# Patient Record
Sex: Female | Born: 1970 | Race: White | Hispanic: No | Marital: Married | Smoking: Never smoker
Health system: Southern US, Community
[De-identification: ages and names within clinical notes are randomized; demographics above are authoritative.]

## PROBLEM LIST (undated history)

## (undated) DIAGNOSIS — M797 Fibromyalgia: Secondary | ICD-10-CM

## (undated) DIAGNOSIS — E079 Disorder of thyroid, unspecified: Secondary | ICD-10-CM

## (undated) DIAGNOSIS — R519 Headache, unspecified: Secondary | ICD-10-CM

## (undated) DIAGNOSIS — E039 Hypothyroidism, unspecified: Secondary | ICD-10-CM

## (undated) DIAGNOSIS — I10 Essential (primary) hypertension: Secondary | ICD-10-CM

## (undated) DIAGNOSIS — E240 Pituitary-dependent Cushing's disease: Secondary | ICD-10-CM

## (undated) HISTORY — PX: TUBAL LIGATION: SHX77

## (undated) HISTORY — PX: CERVICAL FUSION: SHX112

---

## 2015-09-17 DIAGNOSIS — M542 Cervicalgia: Secondary | ICD-10-CM | POA: Insufficient documentation

## 2015-09-17 DIAGNOSIS — R29898 Other symptoms and signs involving the musculoskeletal system: Secondary | ICD-10-CM | POA: Insufficient documentation

## 2015-09-17 DIAGNOSIS — M5412 Radiculopathy, cervical region: Secondary | ICD-10-CM | POA: Insufficient documentation

## 2016-09-01 DIAGNOSIS — I10 Essential (primary) hypertension: Secondary | ICD-10-CM | POA: Insufficient documentation

## 2017-07-01 DIAGNOSIS — E538 Deficiency of other specified B group vitamins: Secondary | ICD-10-CM | POA: Insufficient documentation

## 2017-07-01 DIAGNOSIS — E559 Vitamin D deficiency, unspecified: Secondary | ICD-10-CM | POA: Insufficient documentation

## 2018-03-08 DIAGNOSIS — K635 Polyp of colon: Secondary | ICD-10-CM | POA: Insufficient documentation

## 2018-03-08 DIAGNOSIS — G5600 Carpal tunnel syndrome, unspecified upper limb: Secondary | ICD-10-CM | POA: Insufficient documentation

## 2018-03-08 DIAGNOSIS — G8929 Other chronic pain: Secondary | ICD-10-CM | POA: Insufficient documentation

## 2018-03-08 DIAGNOSIS — M797 Fibromyalgia: Secondary | ICD-10-CM

## 2018-03-08 DIAGNOSIS — F429 Obsessive-compulsive disorder, unspecified: Secondary | ICD-10-CM | POA: Insufficient documentation

## 2018-03-08 DIAGNOSIS — G4730 Sleep apnea, unspecified: Secondary | ICD-10-CM | POA: Insufficient documentation

## 2018-03-08 DIAGNOSIS — F32A Depression, unspecified: Secondary | ICD-10-CM | POA: Insufficient documentation

## 2018-03-13 DIAGNOSIS — M7501 Adhesive capsulitis of right shoulder: Secondary | ICD-10-CM | POA: Insufficient documentation

## 2018-04-11 DIAGNOSIS — D170 Benign lipomatous neoplasm of skin and subcutaneous tissue of head, face and neck: Secondary | ICD-10-CM | POA: Insufficient documentation

## 2018-06-19 DIAGNOSIS — M174 Other bilateral secondary osteoarthritis of knee: Secondary | ICD-10-CM | POA: Insufficient documentation

## 2019-05-30 DIAGNOSIS — M50023 Cervical disc disorder at C6-C7 level with myelopathy: Secondary | ICD-10-CM | POA: Insufficient documentation

## 2019-10-12 ENCOUNTER — Ambulatory Visit: Payer: Self-pay | Attending: Internal Medicine

## 2019-10-12 DIAGNOSIS — Z23 Encounter for immunization: Secondary | ICD-10-CM

## 2019-10-12 NOTE — Progress Notes (Signed)
   Covid-19 Vaccination Clinic  Name:  Cheyenne Lynn    MRN: [id number] DOB: 1971-01-06  10/12/2019  [name] was observed post Covid-19 immunization for 15 minutes without incident. She was provided with Vaccine Information Sheet and instruction to access the V-Safe system.   [name] was instructed to call 911 with any severe reactions post vaccine: Marland Kitchen Difficulty breathing  . Swelling of face and throat  . A fast heartbeat  . A bad rash all over body  . Dizziness and weakness   Immunizations Administered    Name Date Dose VIS Date Route   Pfizer COVID-19 Vaccine 10/12/2019  1:41 PM 0.3 mL 07/06/2019 Intramuscular   Manufacturer: [place]   Lot: [id number]   [place]: [id number]

## 2019-11-06 ENCOUNTER — Ambulatory Visit: Payer: Self-pay | Attending: Internal Medicine

## 2019-11-06 DIAGNOSIS — Z23 Encounter for immunization: Secondary | ICD-10-CM

## 2019-11-06 NOTE — Progress Notes (Signed)
   Covid-19 Vaccination Clinic  Name:  Daelin Cichy    MRN: [id number] DOB: [birth year]  11/06/2019  Ms. Markie was observed post Covid-19 immunization for 15 minutes without incident. She was provided with Vaccine Information Sheet and instruction to access the V-Safe system.   Ms. Disharoon was instructed to call 911 with any severe reactions post vaccine: Marland Kitchen Difficulty breathing  . Swelling of face and throat  . A fast heartbeat  . A bad rash all over body  . Dizziness and weakness   Immunizations Administered    Name Date Dose VIS Date Route   Pfizer COVID-19 Vaccine 11/06/2019  2:29 PM 0.3 mL 07/06/2019 Intramuscular   Manufacturer: [place]   Lot: [id number]   [place]: [id number]

## 2020-01-13 DIAGNOSIS — M2550 Pain in unspecified joint: Secondary | ICD-10-CM | POA: Insufficient documentation

## 2020-03-14 DIAGNOSIS — Z79899 Other long term (current) drug therapy: Secondary | ICD-10-CM | POA: Insufficient documentation

## 2021-06-09 ENCOUNTER — Ambulatory Visit
Admission: RE | Admit: 2021-06-09 | Discharge: 2021-06-09 | Disposition: A | Discharge status: Home / Self Care | Payer: BC Managed Care – PPO | Source: Ambulatory Visit

## 2021-06-09 ENCOUNTER — Other Ambulatory Visit: Payer: Self-pay

## 2021-06-09 ENCOUNTER — Ambulatory Visit (INDEPENDENT_AMBULATORY_CARE_PROVIDER_SITE_OTHER)
Admission: RE | Admit: 2021-06-09 | Discharge: 2021-06-09 | Disposition: A | Discharge status: Home / Self Care | Payer: BC Managed Care – PPO | Source: Ambulatory Visit | Attending: Emergency Medicine | Admitting: Emergency Medicine

## 2021-06-09 VITALS — BP 161/94 | HR 77 | Temp 98.6°F | Resp 18 | Ht 62.0 in | Wt 283.0 lb

## 2021-06-09 DIAGNOSIS — I10 Essential (primary) hypertension: Secondary | ICD-10-CM

## 2021-06-09 DIAGNOSIS — R519 Headache, unspecified: Secondary | ICD-10-CM

## 2021-06-09 DIAGNOSIS — M797 Fibromyalgia: Secondary | ICD-10-CM

## 2021-06-09 HISTORY — DX: Essential (primary) hypertension: I10

## 2021-06-09 HISTORY — DX: Fibromyalgia: M79.7

## 2021-06-09 MED ORDER — METHYLPREDNISOLONE 4 MG PO TBPK: ORAL_TABLET | ORAL | 0 refills | Status: DC

## 2021-06-09 NOTE — ED Triage Notes (Signed)
C/o right sided headache that began on 10/29, reported as a dull ache since 05/29/21

## 2021-06-09 NOTE — ED Provider Notes (Signed)
[facility]    CSN: [id number] Arrival date & time: 06/09/21  1343      History   Chief Complaint Chief Complaint  Patient presents with   Headache   Appt@2p     HPI Cheyenne Lynn is a 50 y.o. female.   HPI  50 year old female here for evaluation of right-sided headache.  Patient reports that her headache began on the right side of her head on 05/23/2021 and presented like her typical migraine.  She reports that the pain got worse over period of couple days and then seemed to get better before we intensifying on 05/29/2021.  She states that it is sharp will decrease throughout the day but then increases when she goes to bed at night.  If she lays on her right side or puts pressure on the right side of her head the pain improves but when she relieves pressure or lays on her left side the pain returns and wakes her up from sleep.  She states her pain level as a 7 out of 10 constantly.  She states that when she applies heat to the right side of her face when she lays down she has a salty taste in her mouth but denies any sweet taste.  She has no nasal congestion or runny nose.  She does have blurry vision in her central vision of her right eye and describes it as a flash bulb.that tracks with her eye movements and is there constantly.  She has had no nausea or vomiting, light sensitive or sound sensitivity, as she normally does with her migraines.  She also denies numbness or tingling in any of her extremities.  She has not had any falls or head injuries.  She did have a cervical fusion a year ago.  She is currently being evaluated by neurology for tremors that developed approximately a year ago.  Patient also has decreased grip strength in her right hand and in her right upper arm that has been present for the past year.  She does see neurology for the tremors but has not consulted them about her headache.  Her migraines are managed by her primary care provider.  Patient reports  that she has been to her dentist to have her pain evaluated because she thought it may be coming from her teeth and they could not find any abnormality.  She also recently had new glasses prescribed and she returns to her ophthalmologist for evaluation last week who states they cannot find any intraocular abnormalities.  Past Medical History:  Diagnosis Date   Fibromyalgia    Hypertension     Patient Active Problem List   Diagnosis Date Noted   Fibromyalgia 06/09/2021   Hypertension 06/09/2021    Past Surgical History:  Procedure Laterality Date   CERVICAL FUSION     TUBAL LIGATION      OB History   No obstetric history on file.      Home Medications    Prior to Admission medications   Medication Sig Start Date End Date Taking? Authorizing Provider  celecoxib (CELEBREX) 100 MG capsule Take by mouth. 08/02/20  Yes [provider]  methylPREDNISolone (MEDROL DOSEPAK) 4 MG TBPK tablet Take according to the package insert. 06/09/21  Yes Margarette Canada, NP  norethindrone-ethinyl estradiol (LOESTRIN) 1-20 MG-MCG tablet Take by mouth. 03/25/17  Yes [provider]    Family History History reviewed. No pertinent family history.  Social History Social History   Tobacco Use   Smoking status:  Never   Smokeless tobacco: Never  Vaping Use   Vaping Use: Never used  Substance Use Topics   Alcohol use: Not Currently   Drug use: Not Currently     Allergies   Patient has no known allergies.   Review of Systems Review of Systems  Constitutional:  Negative for activity change, appetite change and fever.  HENT:  Negative for congestion, ear pain and rhinorrhea.   Eyes:  Positive for visual disturbance. Negative for photophobia, pain, discharge and itching.  Respiratory:  Negative for cough.   Gastrointestinal:  Negative for nausea and vomiting.  Neurological:  Positive for tremors, weakness and headaches. Negative for numbness.  Hematological: Negative.    Psychiatric/Behavioral: Negative.      Physical Exam Triage Vital Signs ED Triage Vitals  Enc Vitals Group     BP 06/09/21 1448 (!) 161/94     Pulse Rate 06/09/21 1448 77     Resp 06/09/21 1448 18     Temp 06/09/21 1448 98.6 F (37 C)     Temp Source 06/09/21 1448 Oral     SpO2 06/09/21 1448 98 %     Weight 06/09/21 1451 283 lb (128.4 kg)     Height 06/09/21 1451 5\' 2"  (1.575 m)     Head Circumference --      Peak Flow --      Pain Score 06/09/21 1442 6     Pain Loc --      Pain Edu? --      Excl. in [place]? --    No data found.  Updated Vital Signs BP (!) 161/94 (BP Location: Left Arm)   Pulse 77   Temp 98.6 F (37 C) (Oral)   Resp 18   Ht 5\' 2"  (1.575 m)   Wt 283 lb (128.4 kg)   SpO2 98%   BMI 51.76 kg/m   Visual Acuity Right Eye Distance:   Left Eye Distance:   Bilateral Distance:    Right Eye Near:   Left Eye Near:    Bilateral Near:     Physical Exam Vitals and nursing note reviewed.  Constitutional:      General: She is not in acute distress.    Appearance: Normal appearance. She is not ill-appearing.  HENT:     Head: Normocephalic and atraumatic.     Right Ear: Tympanic membrane, ear canal and external ear normal. There is no impacted cerumen.     Left Ear: Tympanic membrane, ear canal and external ear normal. There is no impacted cerumen.     Nose: Nose normal. No congestion or rhinorrhea.     Mouth/Throat:     Mouth: Mucous membranes are moist.     Pharynx: Oropharynx is clear. No posterior oropharyngeal erythema.  Eyes:     General: No scleral icterus.       Right eye: No discharge.        Left eye: No discharge.     Extraocular Movements: Extraocular movements intact.     Pupils: Pupils are equal, round, and reactive to light.  Cardiovascular:     Rate and Rhythm: Normal rate and regular rhythm.     Pulses: Normal pulses.     Heart sounds: Normal heart sounds. No murmur heard.   No gallop.  Pulmonary:     Effort: Pulmonary effort is  normal.     Breath sounds: Normal breath sounds. No wheezing, rhonchi or rales.  Musculoskeletal:     Cervical back: Normal range of  motion and neck supple.  Skin:    General: Skin is warm and dry.     Capillary Refill: Capillary refill takes less than 2 seconds.     Findings: No erythema or rash.  Neurological:     General: No focal deficit present.     Mental Status: She is alert and oriented to person, place, and time.     Cranial Nerves: No cranial nerve deficit.     Sensory: No sensory deficit.     Motor: Weakness present.     Deep Tendon Reflexes: Reflexes abnormal.  Psychiatric:        Mood and Affect: Mood normal.        Behavior: Behavior normal.        Thought Content: Thought content normal.        Judgment: Judgment normal.     UC Treatments / Results  Labs (all labs ordered are listed, but only abnormal results are displayed) Labs Reviewed - No data to display  EKG   Radiology CT Head Wo Contrast  Result Date: 06/09/2021 CLINICAL DATA:  Headache.  New neuro deficit EXAM: CT HEAD WITHOUT CONTRAST TECHNIQUE: Contiguous axial images were obtained from the base of the skull through the vertex without intravenous contrast. COMPARISON:  None. FINDINGS: Brain: No acute intracranial hemorrhage. No focal mass lesion. No CT evidence of acute infarction. No midline shift or mass effect. No hydrocephalus. Basilar cisterns are patent. Vascular: No hyperdense vessel or unexpected calcification. Skull: Normal. Negative for fracture or focal lesion. Sinuses/Orbits: Paranasal sinuses and mastoid air cells are clear. Orbits are clear. Other: None. IMPRESSION: Normal at CT. Electronically Signed   By: Suzy Bouchard M.D.   On: 06/09/2021 16:16    Procedures Procedures (including critical care time)  Medications Ordered in UC Medications - No data to display  Initial Impression / Assessment and Plan / UC Course  I have reviewed the triage vital signs and the nursing  notes.  Pertinent labs & imaging results that were available during my care of the patient were reviewed by me and considered in my medical decision making (see chart for details).  Patient is a pleasant, nontoxic-appearing 50 year old female here for evaluation of right-sided headache that has been present for the last 17 days and has been unabated by migraine medication, Tylenol, and over-the-counter NSAIDs.  Patient is currently taking low-dose naltrexone for fibromyalgia which is not any effect on her headache pain.  Patient is alert and oriented x3 and cranial nerves II through XII are intact.  Patient does have decreased grip strength in her right hand and is decreased right upper extremity strength at 3/5 versus her left which is 5/5.  Bilateral lower extremity length is 5/5.  Patient has 2+ DTRs in bilateral upper extremities but her lower extremity DTRs are nonreactive.  She states that this is normal for her.  Pupils equal round and reactive and EOMs intact.  Patient's nasal mucosa is pink and moist without erythema, edema, or discharge.  Patient does have tenderness to percussion of her frontal and maxillary sinuses though she states it more affects her headache versus direct pain to her sinus cavities.  She is had no nasal discharge.  Cardiopulmonary exam is clear lung sounds in all fields.  The etiology of the patient's headache is unclear but it is concerning because it has been present and constant at a 7/10 for the last 17 days.  We will contact her insurance to attempt a prior Auth for head  CT without contrast to look for mass, lesion, or bleed that might explain her visual disturbance and headache.  If her head CT is normal I will have her follow-up with her neurologist and discharge her home on a low-dose steroid pack to see if that helps her headache.  Radiology impression head CT scan is negative.  There is no intracranial hemorrhage, mass lesion, or evidence of acute infarction.  The  sinuses and paranasal sinuses are clear.  The etiology of patient's headache is unclear though it may be status migrainous.  We will trial a low-dose steroid pack and have patient follow-up with her neurologist for continued symptoms.   Final Clinical Impressions(s) / UC Diagnoses   Final diagnoses:  Acute intractable headache, unspecified headache type     Discharge Instructions      Your head CT was negative for any mass or bleed and it did not show any evidence of sinusitis.  Your headache may be a result of what is called status migrainous which is a refractory, continuous migraine headache.  I am going to treat you with a Medrol Dosepak, which is low-dose steroid, and when she does start tomorrow morning with breakfast.  You will take it over a period of 6 days according to the package insert.  If you do not notice an improvement in your pain, or your symptoms worsen, I recommend you follow-up with your neurologist and she will need an MRI of your head.     ED Prescriptions     Medication Sig Dispense Auth. Provider   methylPREDNISolone (MEDROL DOSEPAK) 4 MG TBPK tablet Take according to the package insert. 1 each Margarette Canada, NP      PDMP not reviewed this encounter.   Margarette Canada, NP 06/09/21 [phone]

## 2021-06-09 NOTE — ED Triage Notes (Signed)
Per insurance no prior Auth needed. Ref#[id number]

## 2021-06-09 NOTE — Discharge Instructions (Addendum)
Your head CT was negative for any mass or bleed and it did not show any evidence of sinusitis.  Your headache may be a result of what is called status migrainous which is a refractory, continuous migraine headache.  I am going to treat you with a Medrol Dosepak, which is low-dose steroid, and when she does start tomorrow morning with breakfast.  You will take it over a period of 6 days according to the package insert.  If you do not notice an improvement in your pain, or your symptoms worsen, I recommend you follow-up with your neurologist and she will need an MRI of your head.

## 2021-08-25 ENCOUNTER — Ambulatory Visit: Payer: Self-pay

## 2021-09-18 DIAGNOSIS — E236 Other disorders of pituitary gland: Secondary | ICD-10-CM | POA: Insufficient documentation

## 2021-10-06 DIAGNOSIS — S0990XA Unspecified injury of head, initial encounter: Secondary | ICD-10-CM | POA: Insufficient documentation

## 2021-10-13 ENCOUNTER — Ambulatory Visit: Payer: Self-pay

## 2021-11-10 ENCOUNTER — Ambulatory Visit
Admission: RE | Admit: 2021-11-10 | Discharge: 2021-11-10 | Disposition: A | Discharge status: Home / Self Care | Payer: BC Managed Care – PPO | Source: Ambulatory Visit | Attending: Emergency Medicine | Admitting: Emergency Medicine

## 2021-11-10 ENCOUNTER — Ambulatory Visit (INDEPENDENT_AMBULATORY_CARE_PROVIDER_SITE_OTHER): Payer: BC Managed Care – PPO

## 2021-11-10 ENCOUNTER — Ambulatory Visit
Admit: 2021-11-10 | Discharge: 2021-11-10 | Disposition: A | Discharge status: Home / Self Care | Payer: BC Managed Care – PPO | Attending: Emergency Medicine | Admitting: Emergency Medicine

## 2021-11-10 VITALS — BP 163/98 | HR 100 | Temp 98.2°F | Resp 18

## 2021-11-10 DIAGNOSIS — M79605 Pain in left leg: Secondary | ICD-10-CM | POA: Diagnosis not present

## 2021-11-10 DIAGNOSIS — I10 Essential (primary) hypertension: Secondary | ICD-10-CM | POA: Insufficient documentation

## 2021-11-10 DIAGNOSIS — M79662 Pain in left lower leg: Secondary | ICD-10-CM | POA: Diagnosis not present

## 2021-11-10 HISTORY — DX: Disorder of thyroid, unspecified: E07.9

## 2021-11-10 NOTE — ED Triage Notes (Signed)
Pt her with left leg aching x 2.5 weeks. Pt has a brain tumor and cushings disease and states her husband made her come.  ?

## 2021-11-10 NOTE — ED Provider Notes (Signed)
?[facility] ? ? ? ?CSN: [id number] ?Arrival date & time: 11/10/21  1101 ? ? ?  ? ?History   ?Chief Complaint ?Chief Complaint  ?Patient presents with  ? Leg Pain  ?  Entered by patient  ? ? ?HPI ?Cheyenne Lynn is a 51 y.o. female.  Patient presents with left anterior lower leg pain x2.5 weeks.  No falls or trauma.  She reports the pain is intermittent and associated with tingling and weakness when pain occurs.  No wounds, bruising, redness, unusual swelling, chest pain, shortness of breath, or other symptoms.  Patient states she was recently diagnosed with Cushing's disease and pituitary adenoma.  She is concerned for possible occult fracture due to demineralization or possible blood clot.  Her medical history includes pituitary adenoma, chronic migraines, fibromyalgia, hypertension, morbid obesity, cervical disc disorder, cervical radiculopathy, chronic knee pain, obsessive-compulsive disorder, sleep apnea. ? ?The history is provided by the patient and medical records.  ? ?Past Medical History:  ?Diagnosis Date  ? Fibromyalgia   ? Hypertension   ? Thyroid disease   ? ? ?Patient Active Problem List  ? Diagnosis Date Noted  ? Head trauma 10/06/2021  ? 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 Hypertension 06/09/2021  ? Encounter for medication management 03/14/2020  ? Multiple joint pain 01/13/2020  ? 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 Other bilateral secondary osteoarthritis of knee 06/19/2018  ? Lipoma of scalp 04/11/2018  ? 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 Fibromyalgia 03/08/2018  ? Carpal tunnel syndrome 03/08/2018  ? Chronic knee pain 03/08/2018  ? Colon polyps 03/08/2018  ? Depression 03/08/2018  ? OCD (obsessive compulsive disorder) 03/08/2018  ? Sleep apnea 03/08/2018  ? Vitamin B12 deficiency 07/01/2017  ? Vitamin D deficiency 07/01/2017  ? Morbid obesity due to excess calories ([place]) 11/01/2016  ? Essential hypertension 09/01/2016  ? Cervical  radiculopathy 09/17/2015  ? Upper extremity weakness 09/17/2015  ? Neck pain 09/17/2015  ? ? ?Past Surgical History:  ?Procedure Laterality Date  ? CERVICAL FUSION    ? TUBAL LIGATION    ? ? ?OB History   ?No obstetric history on file. ?  ? ? ? ?Home Medications   ? ?Prior to Admission medications   ?Medication Sig Start Date End Date Taking? Authorizing Provider  ?celecoxib (CELEBREX) 100 MG capsule Take by mouth. 09/26/21  Yes [provider]  ?cyanocobalamin (,VITAMIN B-12,) 1000 MCG/ML injection Inject into the muscle. 07/01/17  Yes [provider]  ?NALTREXONE HCL PO Take 4.5 mg by mouth 2 (two) times daily. 09/21/21  Yes [provider]  ?norethindrone-ethinyl estradiol (LOESTRIN) 1-20 MG-MCG tablet Take by mouth. 08/04/21  Yes [provider]  ?amLODipine (NORVASC) 2.5 MG tablet Take 2.5 mg by mouth daily. 10/23/21   [provider]  ?buPROPion (WELLBUTRIN) 75 MG tablet Take 75 mg by mouth daily. 10/15/21   [provider]  ?cabergoline (DOSTINEX) 0.5 MG tablet Take by mouth. 11/02/21   [provider]  ?celecoxib (CELEBREX) 100 MG capsule Take by mouth. 08/02/20   [provider]  ?cyclobenzaprine (FLEXERIL) 10 MG tablet Take 10 mg by mouth at bedtime. 09/20/21   [provider]  ?dexamethasone (DECADRON) 1 MG tablet Take 1 mg by mouth once. 10/14/21   [provider]  ?levothyroxine (SYNTHROID) 50 MCG tablet Take 50 mcg by mouth daily. 11/01/21   [provider]  ?methylPREDNISolone (MEDROL DOSEPAK) 4 MG TBPK tablet Take according to the package insert.  06/09/21   Margarette Canada, NP  ?norethindrone-ethinyl estradiol (LOESTRIN) 1-20 MG-MCG tablet Take by mouth. 03/25/17   [provider]  ?topiramate (TOPAMAX) 25 MG tablet Take 75 mg by mouth at bedtime. 10/08/21   [provider]  ?VITAMIN D, CHOLECALCIFEROL, PO Take by mouth.    [provider]  ?zonisamide (ZONEGRAN) 100 MG capsule SMARTSIG:1  Capsule(s) By Mouth Every Evening 10/27/21   [provider]  ? ? ?Family History ?History reviewed. No pertinent family history. ? ?Social History ?Social History  ? ?Tobacco Use  ? Smoking status: Never  ? Smokeless tobacco: Never  ?Vaping Use  ? Vaping Use: Never used  ?Substance Use Topics  ? Alcohol use: Not Currently  ? Drug use: Not Currently  ? ? ? ?Allergies   ?Eggs or egg-derived products, Onion, Procaine, Chicken allergy, and Gabapentin ? ? ?Review of Systems ?Review of Systems  ?Constitutional:  Negative for chills and fever.  ?Respiratory:  Negative for cough and shortness of breath.   ?Cardiovascular:  Negative for chest pain and palpitations.  ?Musculoskeletal:  Positive for arthralgias and gait problem.  ?Skin:  Negative for color change, rash and wound.  ?Neurological:  Negative for weakness and numbness.  ?     Tingling and weakness in left lower leg with pain episodes.  ?All other systems reviewed and are negative. ? ? ?Physical Exam ?Triage Vital Signs ?ED Triage Vitals  ?Enc Vitals Group  ?   BP   ?   Pulse   ?   Resp   ?   Temp   ?   Temp src   ?   SpO2   ?   Weight   ?   Height   ?   Head Circumference   ?   Peak Flow   ?   Pain Score   ?   Pain Loc   ?   Pain Edu?   ?   Excl. in [place]?   ? ?No data found. ? ?Updated Vital Signs ?BP (!) 163/98   Pulse 100   Temp 98.2 ?F (36.8 ?C)   Resp 18   SpO2 95%  ? ?Visual Acuity ?Right Eye Distance:   ?Left Eye Distance:   ?Bilateral Distance:   ? ?Right Eye Near:   ?Left Eye Near:    ?Bilateral Near:    ? ?Physical Exam ?Vitals and nursing note reviewed.  ?Constitutional:   ?   General: She is not in acute distress. ?   Appearance: She is well-developed. She is obese. She is not ill-appearing.  ?HENT:  ?   Mouth/Throat:  ?   Mouth: Mucous membranes are moist.  ?Cardiovascular:  ?   Rate and Rhythm: Normal rate and regular rhythm.  ?   Heart sounds: Normal heart sounds.  ?Pulmonary:  ?   Effort: Pulmonary effort is normal. No respiratory  distress.  ?   Breath sounds: Normal breath sounds.  ?Musculoskeletal:     ?   General: Swelling and tenderness present. No deformity or signs of injury. Normal range of motion.  ?   Cervical back: Neck supple.  ?     Legs: ? ?   Comments: Bilateral LE edema which patient reports is at baseline.   ?Skin: ?   General: Skin is warm and dry.  ?   Capillary Refill: Capillary refill takes less than 2 seconds.  ?   Findings: No bruising, erythema, lesion or rash.  ?Neurological:  ?   General:  No focal deficit present.  ?   Mental Status: She is alert and oriented to person, place, and time.  ?   Sensory: No sensory deficit.  ?   Motor: No weakness.  ?   Gait: Gait abnormal.  ?   Comments: Limping gait  ?Psychiatric:     ?   Mood and Affect: Mood normal.     ?   Behavior: Behavior normal.  ? ? ? ?UC Treatments / Results  ?Labs ?(all labs ordered are listed, but only abnormal results are displayed) ?Labs Reviewed - No data to display ? ?EKG ? ? ?Radiology ?DG Tibia/Fibula Left ? ?Result Date: 11/10/2021 ?CLINICAL DATA:  Left leg aching for 2-1/2 weeks EXAM: LEFT TIBIA AND FIBULA - 2 VIEW COMPARISON:  None. FINDINGS: There is no acute fracture or dislocation. Alignment is normal. There is degenerative change about the knee affecting the medial and lateral compartments. The soft tissues are unremarkable. IMPRESSION: No acute finding.  Degenerative changes about the knee. Electronically Signed   By: Valetta Mole M.D.   On: 11/10/2021 11:44  ? ?US Venous Img Lower Unilateral Left (DVT) ? ?Result Date: 11/10/2021 ?CLINICAL DATA:  Left lower extremity pain. EXAM: Left LOWER EXTREMITY VENOUS DOPPLER ULTRASOUND TECHNIQUE: Gray-scale sonography with compression, as well as color and duplex ultrasound, were performed to evaluate the deep venous system(s) from the level of the common femoral vein through the popliteal and proximal calf veins. COMPARISON:  None. FINDINGS: VENOUS Normal compressibility of the common femoral, superficial  femoral, and popliteal veins, as well as the visualized calf veins. Visualized portions of profunda femoral vein and great saphenous vein unremarkable. No filling defects to suggest DVT on grayscale or color Doppler imaging. D

## 2021-11-10 NOTE — Discharge Instructions (Addendum)
Your x-ray is normal other than degenerative changes in your knee.  Go to [facility] for the ultrasound of your leg at 1:30 PM.  ?[place] ? ?Your blood pressure is elevated today at 182/104; repeat 163/98.  Please have this rechecked /by your primary care provider in 1 week.     ? ?

## 2022-04-12 ENCOUNTER — Other Ambulatory Visit: Payer: Self-pay | Admitting: Internal Medicine

## 2022-04-12 DIAGNOSIS — Z1231 Encounter for screening mammogram for malignant neoplasm of breast: Secondary | ICD-10-CM

## 2022-05-07 ENCOUNTER — Ambulatory Visit
Admission: RE | Admit: 2022-05-07 | Discharge: 2022-05-07 | Disposition: A | Discharge status: Home / Self Care | Payer: BC Managed Care – PPO | Source: Ambulatory Visit | Attending: Internal Medicine | Admitting: Internal Medicine

## 2022-05-07 DIAGNOSIS — Z1231 Encounter for screening mammogram for malignant neoplasm of breast: Secondary | ICD-10-CM | POA: Insufficient documentation

## 2022-05-10 ENCOUNTER — Other Ambulatory Visit: Payer: Self-pay | Admitting: *Deleted

## 2022-05-10 ENCOUNTER — Inpatient Hospital Stay
Admission: RE | Admit: 2022-05-10 | Discharge: 2022-05-10 | Disposition: A | Discharge status: Home / Self Care | Payer: Self-pay | Source: Ambulatory Visit | Attending: *Deleted | Admitting: *Deleted

## 2022-05-10 DIAGNOSIS — Z1231 Encounter for screening mammogram for malignant neoplasm of breast: Secondary | ICD-10-CM

## 2023-02-04 ENCOUNTER — Encounter: Payer: Self-pay | Admitting: Gastroenterology

## 2023-02-10 ENCOUNTER — Encounter: Payer: Self-pay | Admitting: Gastroenterology

## 2023-02-11 ENCOUNTER — Encounter: Admission: RE | Payer: Self-pay | Source: Ambulatory Visit

## 2023-02-11 ENCOUNTER — Ambulatory Visit
Admission: RE | Admit: 2023-02-11 | Payer: BC Managed Care – PPO | Source: Ambulatory Visit | Admitting: Gastroenterology

## 2023-02-11 SURGERY — COLONOSCOPY
Anesthesia: General

## 2023-04-29 ENCOUNTER — Ambulatory Visit: Payer: BC Managed Care – PPO | Admitting: Registered Nurse

## 2023-04-29 ENCOUNTER — Ambulatory Visit
Admission: RE | Admit: 2023-04-29 | Discharge: 2023-04-29 | Disposition: A | Discharge status: Home / Self Care | Payer: BC Managed Care – PPO | Source: Ambulatory Visit | Attending: Gastroenterology | Admitting: Gastroenterology

## 2023-04-29 ENCOUNTER — Encounter
Admission: RE | Disposition: A | Discharge status: Home / Self Care | Payer: Self-pay | Source: Ambulatory Visit | Attending: Gastroenterology

## 2023-04-29 ENCOUNTER — Encounter: Payer: Self-pay | Admitting: Gastroenterology

## 2023-04-29 DIAGNOSIS — Z860100 Personal history of colon polyps, unspecified: Secondary | ICD-10-CM | POA: Insufficient documentation

## 2023-04-29 DIAGNOSIS — Z7985 Long-term (current) use of injectable non-insulin antidiabetic drugs: Secondary | ICD-10-CM | POA: Insufficient documentation

## 2023-04-29 DIAGNOSIS — K640 First degree hemorrhoids: Secondary | ICD-10-CM | POA: Insufficient documentation

## 2023-04-29 DIAGNOSIS — K5140 Inflammatory polyps of colon without complications: Secondary | ICD-10-CM | POA: Diagnosis not present

## 2023-04-29 DIAGNOSIS — K2960 Other gastritis without bleeding: Secondary | ICD-10-CM | POA: Diagnosis not present

## 2023-04-29 DIAGNOSIS — Z79899 Other long term (current) drug therapy: Secondary | ICD-10-CM | POA: Insufficient documentation

## 2023-04-29 DIAGNOSIS — Z9049 Acquired absence of other specified parts of digestive tract: Secondary | ICD-10-CM | POA: Diagnosis not present

## 2023-04-29 DIAGNOSIS — M797 Fibromyalgia: Secondary | ICD-10-CM | POA: Diagnosis not present

## 2023-04-29 DIAGNOSIS — E039 Hypothyroidism, unspecified: Secondary | ICD-10-CM | POA: Insufficient documentation

## 2023-04-29 DIAGNOSIS — R110 Nausea: Secondary | ICD-10-CM | POA: Insufficient documentation

## 2023-04-29 DIAGNOSIS — K5909 Other constipation: Secondary | ICD-10-CM | POA: Diagnosis not present

## 2023-04-29 DIAGNOSIS — I10 Essential (primary) hypertension: Secondary | ICD-10-CM | POA: Insufficient documentation

## 2023-04-29 DIAGNOSIS — Z6841 Body Mass Index (BMI) 40.0 and over, adult: Secondary | ICD-10-CM | POA: Diagnosis not present

## 2023-04-29 DIAGNOSIS — K2970 Gastritis, unspecified, without bleeding: Secondary | ICD-10-CM | POA: Diagnosis not present

## 2023-04-29 DIAGNOSIS — R1110 Vomiting, unspecified: Secondary | ICD-10-CM | POA: Diagnosis not present

## 2023-04-29 DIAGNOSIS — K219 Gastro-esophageal reflux disease without esophagitis: Secondary | ICD-10-CM | POA: Insufficient documentation

## 2023-04-29 DIAGNOSIS — Z09 Encounter for follow-up examination after completed treatment for conditions other than malignant neoplasm: Secondary | ICD-10-CM | POA: Diagnosis not present

## 2023-04-29 DIAGNOSIS — D128 Benign neoplasm of rectum: Secondary | ICD-10-CM | POA: Diagnosis not present

## 2023-04-29 DIAGNOSIS — Z1211 Encounter for screening for malignant neoplasm of colon: Secondary | ICD-10-CM | POA: Insufficient documentation

## 2023-04-29 HISTORY — PX: ESOPHAGOGASTRODUODENOSCOPY (EGD) WITH PROPOFOL: SHX5813

## 2023-04-29 HISTORY — PX: COLONOSCOPY WITH PROPOFOL: SHX5780

## 2023-04-29 LAB — POCT PREGNANCY, URINE: Preg Test, Ur: NEGATIVE

## 2023-04-29 SURGERY — COLONOSCOPY WITH PROPOFOL
Anesthesia: General

## 2023-04-29 MED ORDER — LIDOCAINE HCL (CARDIAC) PF 100 MG/5ML IV SOSY
PREFILLED_SYRINGE | INTRAVENOUS | Status: DC | PRN
  Administered 2023-04-29: 60 mg via INTRAVENOUS

## 2023-04-29 MED ORDER — LIDOCAINE HCL (PF) 2 % IJ SOLN
INTRAMUSCULAR | Status: AC
  Filled 2023-04-29: qty 5

## 2023-04-29 MED ORDER — PROPOFOL 500 MG/50ML IV EMUL
INTRAVENOUS | Status: DC | PRN
  Administered 2023-04-29: 200 ug/kg/min via INTRAVENOUS

## 2023-04-29 MED ORDER — PROPOFOL 10 MG/ML IV BOLUS
INTRAVENOUS | Status: DC | PRN
Start: 2023-04-29 — End: 2023-04-29
  Administered 2023-04-29: 50 mg via INTRAVENOUS
  Administered 2023-04-29: 100 mg via INTRAVENOUS

## 2023-04-29 MED ORDER — PROPOFOL 10 MG/ML IV BOLUS
INTRAVENOUS | Status: AC
  Filled 2023-04-29: qty 20

## 2023-04-29 MED ORDER — SODIUM CHLORIDE 0.9 % IV SOLN
INTRAVENOUS | Status: DC
  Administered 2023-04-29: 20 mL/h via INTRAVENOUS

## 2023-04-29 MED ORDER — PROPOFOL 1000 MG/100ML IV EMUL
INTRAVENOUS | Status: AC
  Filled 2023-04-29: qty 100

## 2023-04-29 MED ORDER — STERILE WATER FOR IRRIGATION IR SOLN
Status: DC | PRN
  Administered 2023-04-29: 60 mL

## 2023-04-29 NOTE — Op Note (Signed)
[facility] Gastroenterology Patient Name: Cheyenne Lynn Procedure Date: 04/29/2023 1:10 PM MRN: [id number] Account #: [id number] Date of Birth: [birth year] Admit Type: Outpatient Age: 52 Room: [facility] ENDO ROOM 1 Gender: Female Note Status: Supervisor Override Instrument Name: Prentice Docker [id number] Procedure:             Colonoscopy Indications:           Screening for colorectal malignant neoplasm Providers:             Jaynie Collins DO, DO Referring MD:          No Local Md, MD (Referring MD) Medicines:             Monitored Anesthesia Care Complications:         No immediate complications. Estimated blood loss:                         Minimal. Procedure:             Pre-Anesthesia Assessment:                        - Prior to the procedure, a History and Physical was                         performed, and patient medications and allergies were                         reviewed. The patient is competent. The risks and                         benefits of the procedure and the sedation options and                         risks were discussed with the patient. All questions                         were answered and informed consent was obtained.                         Patient identification and proposed procedure were                         verified by the physician, the nurse, the anesthetist                         and the technician in the endoscopy suite. Mental                         Status Examination: alert and oriented. Airway                         Examination: normal oropharyngeal airway and neck                         mobility. Respiratory Examination: clear to                         auscultation. CV Examination: RRR, no murmurs, no S3  or S4. Prophylactic Antibiotics: The patient does not                         require prophylactic antibiotics. Prior                         Anticoagulants: The patient has taken no  anticoagulant                         or antiplatelet agents. ASA Grade Assessment: III - A                         patient with severe systemic disease. After reviewing                         the risks and benefits, the patient was deemed in                         satisfactory condition to undergo the procedure. The                         anesthesia plan was to use monitored anesthesia care                         (MAC). Immediately prior to administration of                         medications, the patient was re-assessed for adequacy                         to receive sedatives. The heart rate, respiratory                         rate, oxygen saturations, blood pressure, adequacy of                         pulmonary ventilation, and response to care were                         monitored throughout the procedure. The physical                         status of the patient was re-assessed after the                         procedure.                        After obtaining informed consent, the colonoscope was                         passed under direct vision. Throughout the procedure,                         the patient's blood pressure, pulse, and oxygen                         saturations were monitored continuously. The  Colonoscope was introduced through the anus and                         advanced to the the terminal ileum, with                         identification of the appendiceal orifice and IC                         valve. The colonoscopy was performed without                         difficulty. The patient tolerated the procedure well.                         The quality of the bowel preparation was evaluated                         using the BBPS [facility] Bowel Preparation Scale) with                         scores of: Right Colon = 2 (minor amount of residual                         staining, small fragments of stool and/or opaque                          liquid, but mucosa seen well), Transverse Colon = 3                         (entire mucosa seen well with no residual staining,                         small fragments of stool or opaque liquid) and Left                         Colon = 3 (entire mucosa seen well with no residual                         staining, small fragments of stool or opaque liquid).                         The total BBPS score equals 8. The quality of the                         bowel preparation was excellent. The terminal ileum,                         ileocecal valve, appendiceal orifice, and rectum were                         photographed. Findings:      The perianal and digital rectal examinations were normal. Pertinent       negatives include normal sphincter tone.      The terminal ileum appeared normal. Estimated blood loss: none.      A 7 to 8 mm polyp was found in the  rectum. The polyp was       semi-pedunculated. The polyp was removed with a hot snare. Resection and       retrieval were complete. Estimated blood loss was minimal.      Non-bleeding internal hemorrhoids were found during retroflexion. The       hemorrhoids were Grade I (internal hemorrhoids that do not prolapse).      The exam was otherwise without abnormality on direct and retroflexion       views. Impression:            - The examined portion of the ileum was normal.                        - One 7 to 8 mm polyp in the rectum, removed with a                         hot snare. Resected and retrieved.                        - Non-bleeding internal hemorrhoids.                        - The examination was otherwise normal on direct and                         retroflexion views. Recommendation:        - Patient has a contact number available for                         emergencies. The signs and symptoms of potential                         delayed complications were discussed with the patient.                         Return to  normal activities tomorrow. Written                         discharge instructions were provided to the patient.                        - Discharge patient to home.                        - Resume previous diet.                        - Continue present medications.                        - No ibuprofen, naproxen, or other non-steroidal                         anti-inflammatory drugs for 5 days after polyp removal.                        - Await pathology results.                        - Repeat colonoscopy for surveillance based on  pathology results.                        - Return to GI office as previously scheduled.                        - The findings and recommendations were discussed with                         the patient. Procedure Code(s):     --- Professional ---                        [phone], Colonoscopy, flexible; with removal of                         tumor(s), polyp(s), or other lesion(s) by snare                         technique Diagnosis Code(s):     --- Professional ---                        Z86.010, Personal history of colonic polyps                        K64.0, First degree hemorrhoids                        D12.8, Benign neoplasm of rectum CPT copyright 2022 American [employer] Association. All rights reserved. The codes documented in this report are preliminary and upon coder review may  be revised to meet current compliance requirements. Attending Participation:      I personally performed the entire procedure. Elfredia Nevins, DO Jaynie Collins DO, DO 04/29/2023 2:06:12 PM This report has been signed electronically. Number of Addenda: 0 Note Initiated On: 04/29/2023 1:10 PM Scope Withdrawal Time: 0 hours 7 minutes 17 seconds  Total Procedure Duration: 0 hours 14 minutes 40 seconds  Estimated Blood Loss:  Estimated blood loss was minimal.      [facility]

## 2023-04-29 NOTE — Anesthesia Preprocedure Evaluation (Signed)
Anesthesia Evaluation  Patient identified by MRN, date of birth, ID band Patient awake    Reviewed: Allergy & Precautions, NPO status , Patient's Chart, lab work & pertinent test results  Airway Mallampati: III  TM Distance: >3 FB Neck ROM: Full    Dental  (+) Teeth Intact   Pulmonary neg pulmonary ROS, sleep apnea    Pulmonary exam normal  + decreased breath sounds      Cardiovascular Exercise Tolerance: Good hypertension, Pt. on medications negative cardio ROS Normal cardiovascular exam Rhythm:Regular Rate:Normal     Neuro/Psych    Depression    S/p pituitary adenonoma resection, Cushings negative neurological ROS  negative psych ROS   GI/Hepatic negative GI ROS, Neg liver ROS,,,  Endo/Other  negative endocrine ROS  Morbid obesity  Renal/GU negative Renal ROS  negative genitourinary   Musculoskeletal   Abdominal  (+) + obese  Peds negative pediatric ROS (+)  Hematology negative hematology ROS (+)   Anesthesia Other Findings Past Medical History: No date: Fibromyalgia No date: Hypertension No date: Thyroid disease  Past Surgical History: No date: CERVICAL FUSION No date: TUBAL LIGATION  BMI    Body Mass Index: 48.40 kg/m      Reproductive/Obstetrics negative OB ROS                             Anesthesia Physical Anesthesia Plan  ASA: 3  Anesthesia Plan: General   Post-op Pain Management:    Induction: Intravenous  PONV Risk Score and Plan: Propofol infusion and TIVA  Airway Management Planned: Natural Airway and Nasal Cannula  Additional Equipment:   Intra-op Plan:   Post-operative Plan:   Informed Consent: I have reviewed the patients History and Physical, chart, labs and discussed the procedure including the risks, benefits and alternatives for the proposed anesthesia with the patient or authorized representative who has indicated his/her understanding and  acceptance.     Dental Advisory Given  Plan Discussed with: CRNA and Surgeon  Anesthesia Plan Comments:        Anesthesia Quick Evaluation

## 2023-04-29 NOTE — Interval H&P Note (Signed)
History and Physical Interval Note: Preprocedure H&P from 04/29/23  was reviewed and there was no interval change after seeing and examining the patient.  Written consent was obtained from the patient after discussion of risks, benefits, and alternatives. Patient has consented to proceed with Esophagogastroduodenoscopy and Colonoscopy with possible intervention   04/29/2023 1:16 PM  Cheyenne Lynn  has presented today for surgery, with the diagnosis of K21.9 (ICD-10-CM) - Gastroesophageal reflux disease, unspecified whether esophagitis present R11.0 (ICD-10-CM) - Nausea Z12.11 (ICD-10-CM) - Colon cancer screening.  The various methods of treatment have been discussed with the patient and family. After consideration of risks, benefits and other options for treatment, the patient has consented to  Procedure(s): COLONOSCOPY WITH PROPOFOL (N/A) ESOPHAGOGASTRODUODENOSCOPY (EGD) WITH PROPOFOL (N/A) as a surgical intervention.  The patient's history has been reviewed, patient examined, no change in status, stable for surgery.  I have reviewed the patient's chart and labs.  Questions were answered to the patient's satisfaction.     Jaynie Collins

## 2023-04-29 NOTE — Op Note (Signed)
[facility] Gastroenterology Patient Name: Cheyenne Lynn Procedure Date: 04/29/2023 1:11 PM MRN: [id number] Account #: [id number] Date of Birth: Jan 24, 1971 Admit Type: Outpatient Age: 52 Room: [facility] ENDO ROOM 1 Gender: Female Note Status: Finalized Instrument Name: Upper Endoscope [id number] Procedure:             Upper GI endoscopy Indications:           Nausea, Regurgitation Providers:             Jaynie Collins DO, DO Referring MD:          No Local Md, MD (Referring MD) Medicines:             Monitored Anesthesia Care Complications:         No immediate complications. Estimated blood loss:                         Minimal. Procedure:             Pre-Anesthesia Assessment:                        - Prior to the procedure, a History and Physical was                         performed, and patient medications and allergies were                         reviewed. The patient is competent. The risks and                         benefits of the procedure and the sedation options and                         risks were discussed with the patient. All questions                         were answered and informed consent was obtained.                         Patient identification and proposed procedure were                         verified by the physician, the nurse, the anesthetist                         and the technician in the endoscopy suite. Mental                         Status Examination: alert and oriented. Airway                         Examination: normal oropharyngeal airway and neck                         mobility. Respiratory Examination: clear to                         auscultation. CV Examination: RRR, no murmurs, no S3  or S4. Prophylactic Antibiotics: The patient does not                         require prophylactic antibiotics. Prior                         Anticoagulants: The patient has taken no anticoagulant                          or antiplatelet agents. ASA Grade Assessment: III - A                         patient with severe systemic disease. After reviewing                         the risks and benefits, the patient was deemed in                         satisfactory condition to undergo the procedure. The                         anesthesia plan was to use monitored anesthesia care                         (MAC). Immediately prior to administration of                         medications, the patient was re-assessed for adequacy                         to receive sedatives. The heart rate, respiratory                         rate, oxygen saturations, blood pressure, adequacy of                         pulmonary ventilation, and response to care were                         monitored throughout the procedure. The physical                         status of the patient was re-assessed after the                         procedure.                        After obtaining informed consent, the endoscope was                         passed under direct vision. Throughout the procedure,                         the patient's blood pressure, pulse, and oxygen                         saturations were monitored continuously. The Endoscope  was introduced through the mouth, and advanced to the                         second part of duodenum. The upper GI endoscopy was                         accomplished without difficulty. The patient tolerated                         the procedure well. Findings:      The duodenal bulb, first portion of the duodenum and second portion of       the duodenum were normal. Estimated blood loss: none.      The Z-line was regular. Estimated blood loss: none.      The examined esophagus was normal. Estimated blood loss: none.      Localized moderate inflammation characterized by erosions, erythema,       granularity and shallow ulcerations was found in the gastric  antrum.       Biopsies were taken with a cold forceps for histology. Biopsies were       taken with a cold forceps for Helicobacter pylori testing. Estimated       blood loss was minimal.      The exam of the stomach was otherwise normal. Impression:            - Normal duodenal bulb, first portion of the duodenum                         and second portion of the duodenum.                        - Z-line regular.                        - Normal esophagus.                        - Gastritis. Biopsied. Recommendation:        - Patient has a contact number available for                         emergencies. The signs and symptoms of potential                         delayed complications were discussed with the patient.                         Return to normal activities tomorrow. Written                         discharge instructions were provided to the patient.                        - Discharge patient to home.                        - Resume previous diet.                        - Continue present medications.                        -  No ibuprofen, naproxen, or other non-steroidal                         anti-inflammatory drugs.                        - PPI twice a day                        consider gastric emptying study                        - Await pathology results.                        - Return to GI clinic as previously scheduled.                        - The findings and recommendations were discussed with                         the patient. Procedure Code(s):     --- Professional ---                        [phone], Esophagogastroduodenoscopy, flexible,                         transoral; with biopsy, single or multiple Diagnosis Code(s):     --- Professional ---                        K29.70, Gastritis, unspecified, without bleeding                        R11.0, Nausea                        R11.10, Vomiting, unspecified CPT copyright 2022 American Medical Association. All  rights reserved. The codes documented in this report are preliminary and upon coder review may  be revised to meet current compliance requirements. Attending Participation:      I personally performed the entire procedure. Elfredia Nevins, DO Jaynie Collins DO, DO 04/29/2023 1:32:52 PM This report has been signed electronically. Number of Addenda: 0 Note Initiated On: 04/29/2023 1:11 PM Estimated Blood Loss:  Estimated blood loss was minimal.      [facility]

## 2023-04-29 NOTE — Transfer of Care (Signed)
Immediate Anesthesia Transfer of Care Note  Patient: Cheyenne Lynn  Procedure(s) Performed: COLONOSCOPY WITH PROPOFOL ESOPHAGOGASTRODUODENOSCOPY (EGD) WITH PROPOFOL  Patient Location: PACU  Anesthesia Type:General  Level of Consciousness: drowsy and patient cooperative  Airway & Oxygen Therapy: Patient Spontanous Breathing  Post-op Assessment: Report given to RN and Post -op Vital signs reviewed and stable  Post vital signs: stable  Last Vitals:  Vitals Value Taken Time  BP 168/96 04/29/23 1405  Temp 37 C 04/29/23 1354  Pulse 72 04/29/23 1404  Resp 17 04/29/23 1409  SpO2 100 % 04/29/23 1404  Vitals shown include unfiled device data.  Last Pain:  Vitals:   04/29/23 1404  TempSrc:   PainSc: 0-No pain         Complications: No notable events documented.

## 2023-04-29 NOTE — H&P (Signed)
Pre-Procedure H&P   Patient ID: Cheyenne Lynn is a 52 y.o. female.  Gastroenterology Provider: Jaynie Collins, DO  Referring Provider: Tawni Pummel, PA PCP: [facility]  Date: 04/29/2023  HPI Ms. Cheyenne Lynn is a 52 y.o. female who presents today for Esophagogastroduodenoscopy and Colonoscopy for GERD, nausea, personal history of colon polyps  Patient with chronic constipation reporting 1 bowel movement per week.  She has no other lower GI symptoms.  She has noted since starting [facility] increased regurgitation nausea and vomiting. Has since stopped all GLP1 meds. She noticed some regurgitation prior to even initiating.  No dysphagia or odynophagia but she does noted globus sensation.  Had a pituitary tumor removed in 2023  Personal history of colon polyps  No family history of colon cancer or colon polyps Status post cholecystectomy  Hemoglobin 13.7 MCV 88 platelets 428,000 iron saturation 17 ferritin 33    Past Medical History:  Diagnosis Date   Fibromyalgia    Hypertension    Thyroid disease     Past Surgical History:  Procedure Laterality Date   CERVICAL FUSION     TUBAL LIGATION      Family History No h/o GI disease or malignancy  Review of Systems  Constitutional:  Negative for activity change, appetite change, chills, diaphoresis, fatigue, fever and unexpected weight change.  HENT:  Negative for trouble swallowing and voice change.   Respiratory:  Negative for shortness of breath and wheezing.   Cardiovascular:  Negative for chest pain, palpitations and leg swelling.  Gastrointestinal:  Positive for constipation, nausea and vomiting. Negative for abdominal distention, abdominal pain, anal bleeding, blood in stool, diarrhea and rectal pain.  Musculoskeletal:  Negative for arthralgias and myalgias.  Skin:  Negative for color change and pallor.  Neurological:  Negative for dizziness, syncope and weakness.   Psychiatric/Behavioral:  Negative for confusion.   All other systems reviewed and are negative.    Medications No current facility-administered medications on file prior to encounter.   Current Outpatient Medications on File Prior to Encounter  Medication Sig Dispense Refill   amLODipine (NORVASC) 2.5 MG tablet Take 2.5 mg by mouth daily.     buPROPion (WELLBUTRIN) 75 MG tablet Take 75 mg by mouth daily.     cabergoline (DOSTINEX) 0.5 MG tablet Take by mouth.     celecoxib (CELEBREX) 100 MG capsule Take by mouth.     celecoxib (CELEBREX) 100 MG capsule Take by mouth.     cyanocobalamin (,VITAMIN B-12,) 1000 MCG/ML injection Inject into the muscle.     cyclobenzaprine (FLEXERIL) 10 MG tablet Take 10 mg by mouth at bedtime.     dexamethasone (DECADRON) 1 MG tablet Take 1 mg by mouth once.     levothyroxine (SYNTHROID) 50 MCG tablet Take 50 mcg by mouth daily.     methylPREDNISolone (MEDROL DOSEPAK) 4 MG TBPK tablet Take according to the package insert. 1 each 0   NALTREXONE HCL PO Take 4.5 mg by mouth 2 (two) times daily.     norethindrone-ethinyl estradiol (LOESTRIN) 1-20 MG-MCG tablet Take by mouth.     norethindrone-ethinyl estradiol (LOESTRIN) 1-20 MG-MCG tablet Take by mouth.     topiramate (TOPAMAX) 25 MG tablet Take 75 mg by mouth at bedtime.     VITAMIN D, CHOLECALCIFEROL, PO Take by mouth.     zonisamide (ZONEGRAN) 100 MG capsule SMARTSIG:1 Capsule(s) By Mouth Every Evening      Pertinent medications related to GI and procedure were reviewed by me  with the patient prior to the procedure   Current Facility-Administered Medications:    0.9 %  sodium chloride infusion, , Intravenous, Continuous, Jaynie Collins, DO, Last Rate: 20 mL/hr at 04/29/23 1238, 20 mL/hr at 04/29/23 1238  sodium chloride 20 mL/hr (04/29/23 1238)       Allergies  Allergen Reactions   Egg-Derived Products Anaphylaxis and Swelling    Swelling and vomiting Swelling and vomiting Swelling and  vomiting Swelling and vomiting    Onion Anaphylaxis   Procaine Anaphylaxis and Other (See Comments)    Patient says she can take other "caines" without difficulties.  Her initial reaction was in childhood. "goes into shock" Patient says she can take other "caines" without difficulties.  Her initial reaction was in childhood.    Chicken Allergy Swelling    Swelling and vomiting   Gabapentin Other (See Comments)    AMS AMS    Allergies were reviewed by me prior to the procedure  Objective   Body mass index is 48.4 kg/m. Vitals:   04/29/23 1212 04/29/23 1216  BP: (!) 171/101   Pulse: 85   Resp: 20   Temp: (!) 97.3 F (36.3 C)   TempSrc: Temporal   SpO2:  100%  Weight: 120 kg   Height: 5\' 2"  (1.575 m)      Physical Exam Vitals and nursing note reviewed.  Constitutional:      General: She is not in acute distress.    Appearance: Normal appearance. She is obese. She is not ill-appearing, toxic-appearing or diaphoretic.  HENT:     Head: Normocephalic and atraumatic.     Nose: Nose normal.     Mouth/Throat:     Mouth: Mucous membranes are moist.     Pharynx: Oropharynx is clear.  Eyes:     General: No scleral icterus.    Extraocular Movements: Extraocular movements intact.  Cardiovascular:     Rate and Rhythm: Normal rate and regular rhythm.     Heart sounds: Normal heart sounds. No murmur heard.    No friction rub. No gallop.  Pulmonary:     Effort: Pulmonary effort is normal. No respiratory distress.     Breath sounds: Normal breath sounds. No wheezing, rhonchi or rales.  Abdominal:     General: Bowel sounds are normal. There is no distension.     Palpations: Abdomen is soft.     Tenderness: There is no abdominal tenderness. There is no guarding or rebound.  Musculoskeletal:     Cervical back: Neck supple.     Right lower leg: No edema.     Left lower leg: No edema.  Skin:    General: Skin is warm and dry.     Coloration: Skin is not jaundiced or pale.   Neurological:     General: No focal deficit present.     Mental Status: She is alert and oriented to person, place, and time. Mental status is at baseline.  Psychiatric:        Mood and Affect: Mood normal.        Behavior: Behavior normal.        Thought Content: Thought content normal.        Judgment: Judgment normal.      Assessment:  Ms. Cheyenne Lynn is a 52 y.o. female  who presents today for Esophagogastroduodenoscopy and Colonoscopy for GERD, nausea, personal history of colon polyps  Plan:  Esophagogastroduodenoscopy and Colonoscopy with possible intervention today  Esophagogastroduodenoscopy and Colonoscopy with possible biopsy,  control of bleeding, polypectomy, and interventions as necessary has been discussed with the patient/patient representative. Informed consent was obtained from the patient/patient representative after explaining the indication, nature, and risks of the procedure including but not limited to death, bleeding, perforation, missed neoplasm/lesions, cardiorespiratory compromise, and reaction to medications. Opportunity for questions was given and appropriate answers were provided. Patient/patient representative has verbalized understanding is amenable to undergoing the procedure.   Jaynie Collins, DO  [facility]  Portions of the record may have been created with voice recognition software. Occasional wrong-word or 'sound-a-like' substitutions may have occurred due to the inherent limitations of voice recognition software.  Read the chart carefully and recognize, using context, where substitutions may have occurred.

## 2023-05-02 ENCOUNTER — Other Ambulatory Visit: Payer: Self-pay | Admitting: Gastroenterology

## 2023-05-02 ENCOUNTER — Encounter: Payer: Self-pay | Admitting: Gastroenterology

## 2023-05-02 DIAGNOSIS — R1110 Vomiting, unspecified: Secondary | ICD-10-CM

## 2023-05-02 DIAGNOSIS — R6881 Early satiety: Secondary | ICD-10-CM

## 2023-05-02 DIAGNOSIS — R112 Nausea with vomiting, unspecified: Secondary | ICD-10-CM

## 2023-05-02 LAB — SURGICAL PATHOLOGY

## 2023-05-02 NOTE — Anesthesia Postprocedure Evaluation (Signed)
Anesthesia Post Note  Patient: Cheyenne Lynn  Procedure(s) Performed: COLONOSCOPY WITH PROPOFOL ESOPHAGOGASTRODUODENOSCOPY (EGD) WITH PROPOFOL  Patient location during evaluation: PACU Anesthesia Type: General Level of consciousness: awake Pain management: satisfactory to patient Vital Signs Assessment: post-procedure vital signs reviewed and stable Respiratory status: spontaneous breathing Cardiovascular status: blood pressure returned to baseline Anesthetic complications: no   No notable events documented.   Last Vitals:  Vitals:   04/29/23 1404 04/29/23 1414  BP: (!) 168/96 (!) 161/84  Pulse: 72 74  Resp:    Temp:    SpO2: 100% 99%    Last Pain:  Vitals:   04/29/23 1414  TempSrc:   PainSc: 0-No pain                 VAN STAVEREN,Brax Walen

## 2023-05-20 ENCOUNTER — Other Ambulatory Visit: Payer: BC Managed Care – PPO

## 2023-10-15 IMAGING — CT CT HEAD W/O CM
1 series · 16 of 30 positions shown, 20 images · non-contrast
Comparison: None.

CLINICAL DATA: Headache.  New neuro deficit

EXAM:
CT HEAD WITHOUT CONTRAST
TECHNIQUE: Contiguous axial images were obtained from the base of the skull
through the vertex without intravenous contrast.

[Series 2: head wo · axial · 0.44mm/px · z∈[-90,+45]mm · 16 of 31 slices shown, 20 images]
[im 2/31  brain]
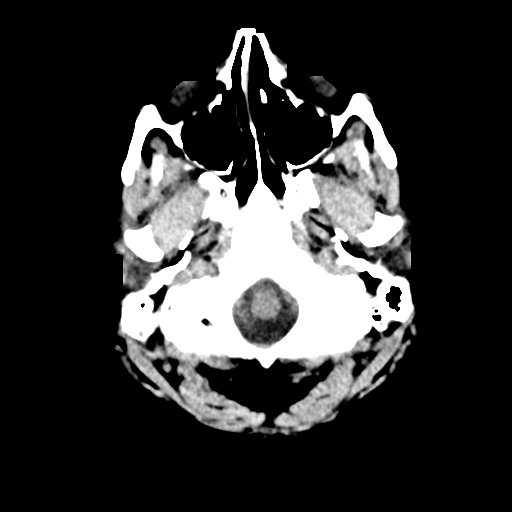
[im 2/31  bone]
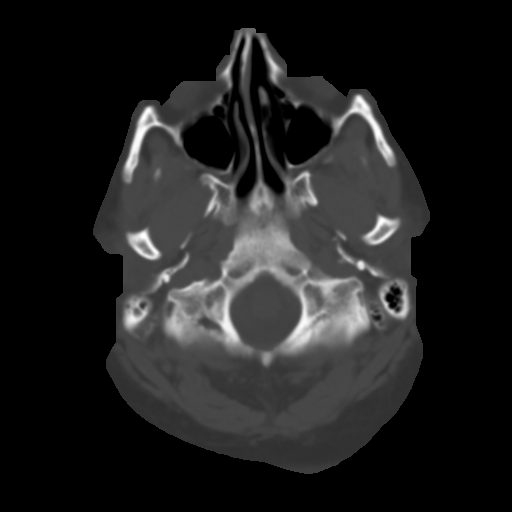
[im 4/31  brain]
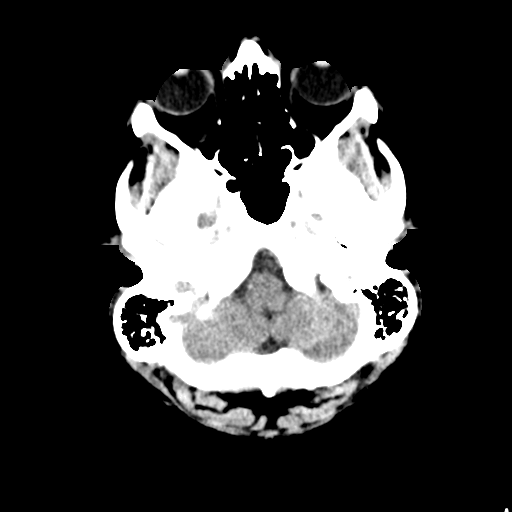
[im 6/31  brain]
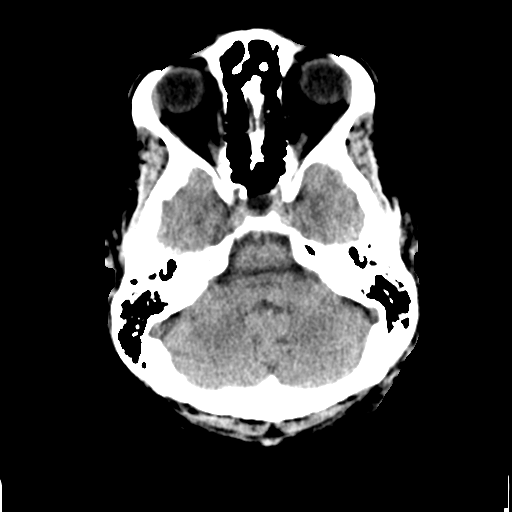
[im 8/31  brain]
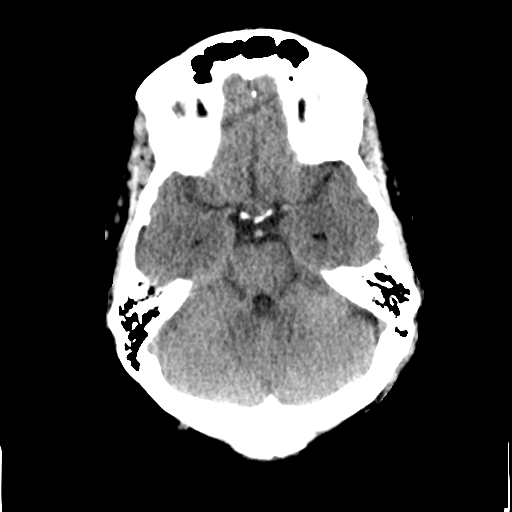
[im 9/31  brain]
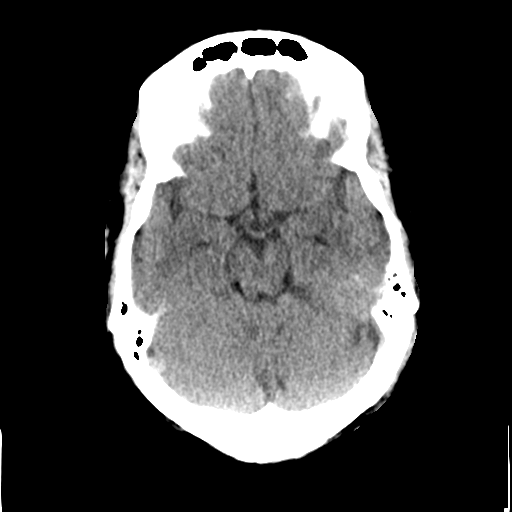
[im 9/31  bone]
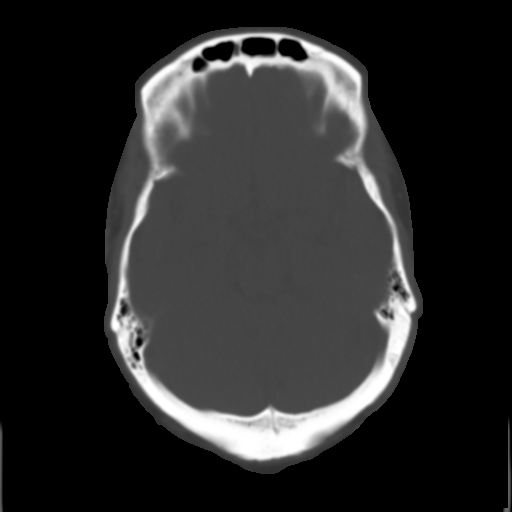
[im 11/31  brain]
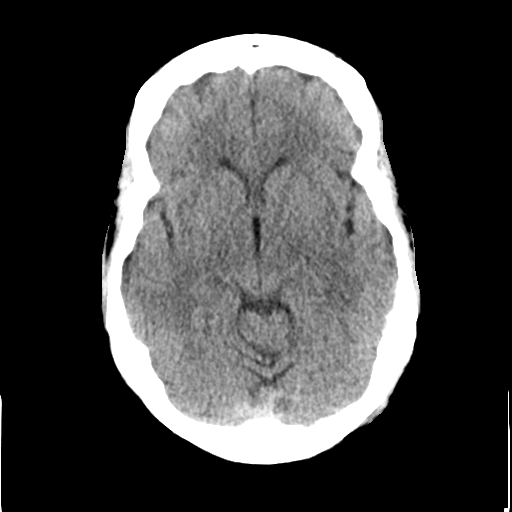
[im 13/31  brain]
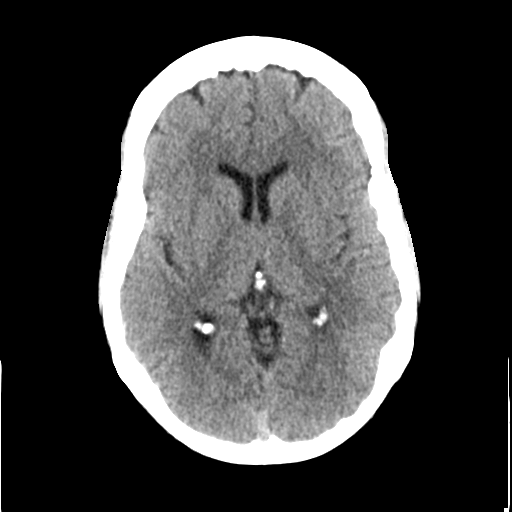
[im 15/31  brain]
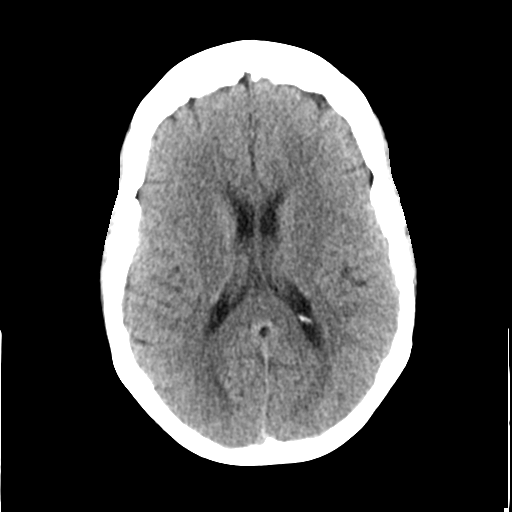
[im 16/31  brain]
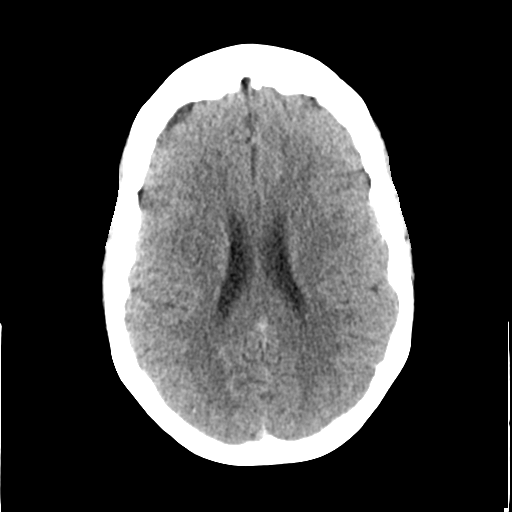
[im 16/31  bone]
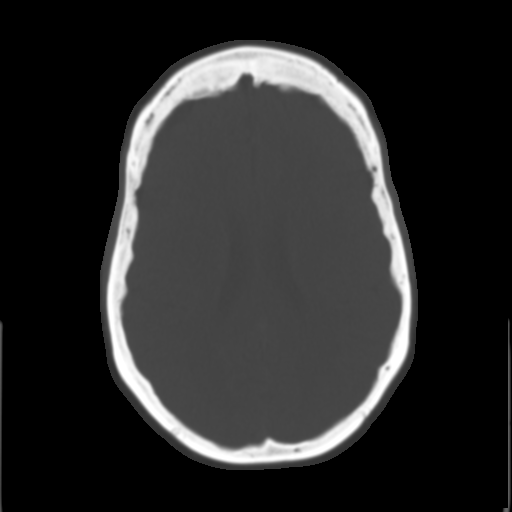
[im 18/31  brain]
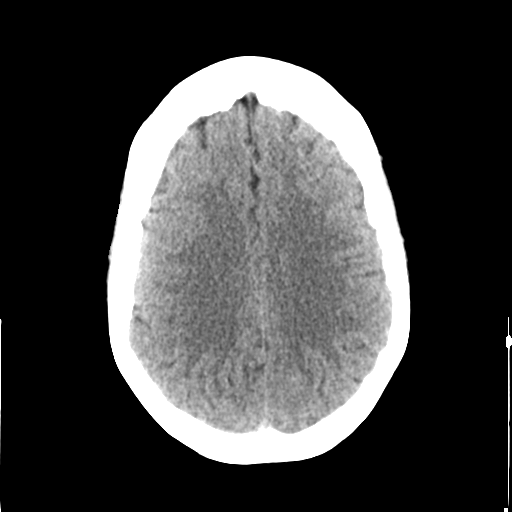
[im 20/31  brain]
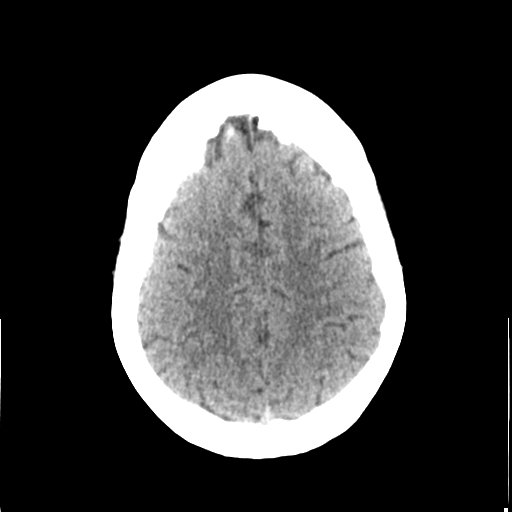
[im 22/31  brain]
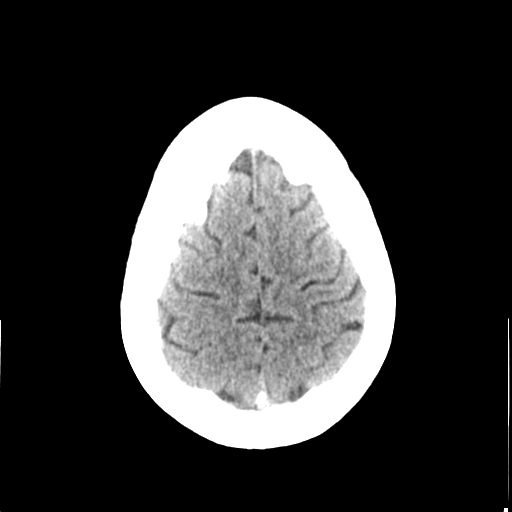
[im 23/31  brain]
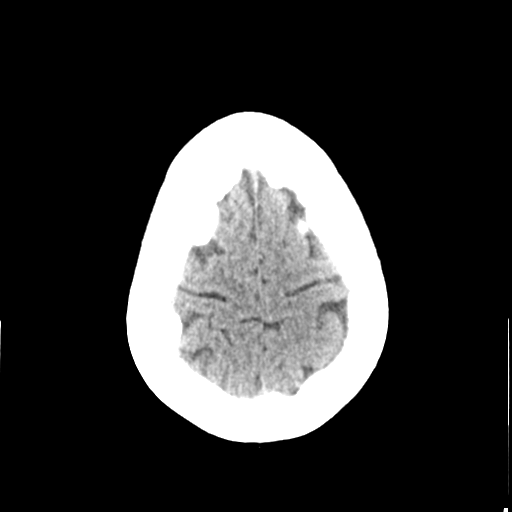
[im 23/31  bone]
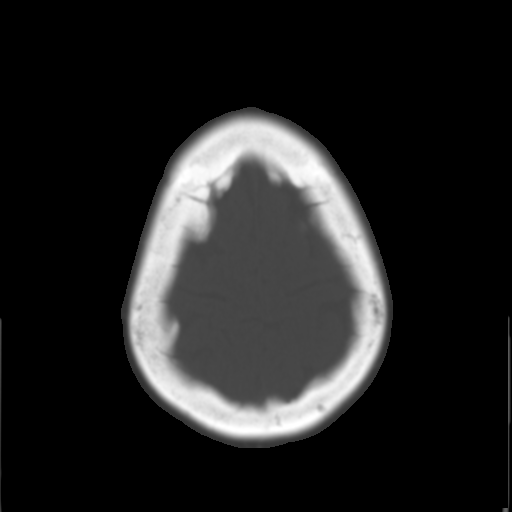
[im 25/31  brain]
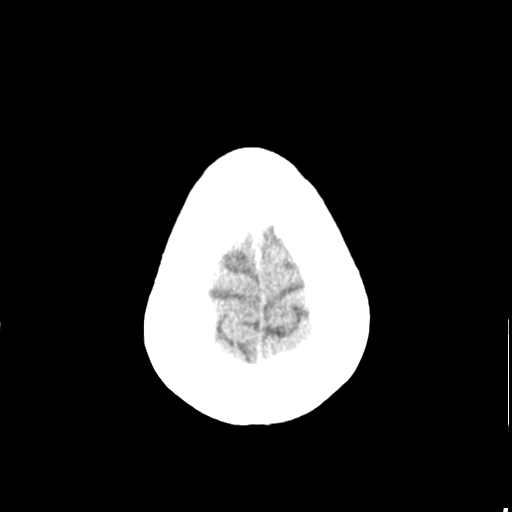
[im 27/31  brain]
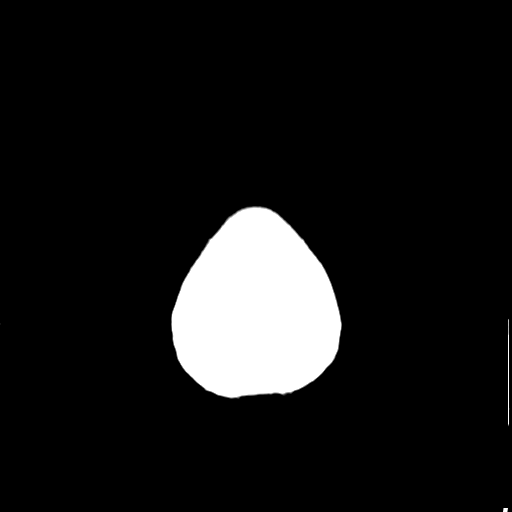
[im 29/31  brain]
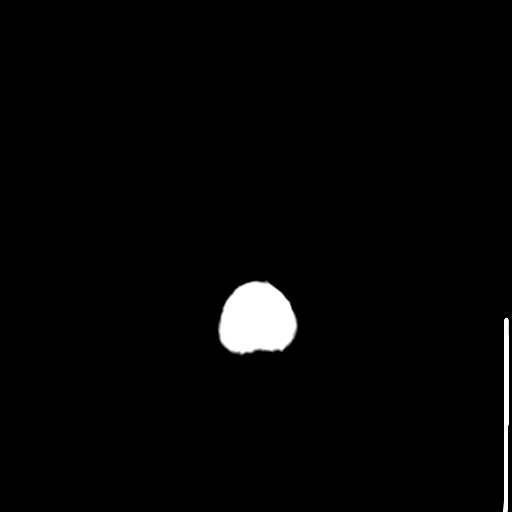

[16 of 30 positions shown; findings below may reference images not displayed]

FINDINGS: Brain: No acute intracranial hemorrhage. No focal mass lesion. No CT
evidence of acute infarction. No midline shift or mass effect. No
hydrocephalus. Basilar cisterns are patent.

Vascular: No hyperdense vessel or unexpected calcification.

Skull: Normal. Negative for fracture or focal lesion.

Sinuses/Orbits: Paranasal sinuses and mastoid air cells are clear.
Orbits are clear.

Other: None.
IMPRESSION: Normal at CT.

## 2023-10-27 ENCOUNTER — Other Ambulatory Visit: Payer: Self-pay | Admitting: Internal Medicine

## 2023-10-27 DIAGNOSIS — Z1231 Encounter for screening mammogram for malignant neoplasm of breast: Secondary | ICD-10-CM

## 2023-11-03 ENCOUNTER — Ambulatory Visit
Admission: RE | Admit: 2023-11-03 | Discharge: 2023-11-03 | Disposition: A | Discharge status: Home / Self Care | Source: Ambulatory Visit | Attending: Internal Medicine | Admitting: Internal Medicine

## 2023-11-03 DIAGNOSIS — Z1231 Encounter for screening mammogram for malignant neoplasm of breast: Secondary | ICD-10-CM | POA: Insufficient documentation
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
## 2024-01-17 ENCOUNTER — Encounter
Admission: RE | Admit: 2024-01-17 | Discharge: 2024-01-17 | Disposition: A | Discharge status: Home / Self Care | Source: Ambulatory Visit | Attending: Obstetrics and Gynecology | Admitting: Obstetrics and Gynecology

## 2024-01-17 ENCOUNTER — Other Ambulatory Visit: Payer: Self-pay

## 2024-01-17 DIAGNOSIS — Z01812 Encounter for preprocedural laboratory examination: Secondary | ICD-10-CM

## 2024-01-17 DIAGNOSIS — Z79899 Other long term (current) drug therapy: Secondary | ICD-10-CM

## 2024-01-17 DIAGNOSIS — G8929 Other chronic pain: Secondary | ICD-10-CM

## 2024-01-17 HISTORY — DX: Hypothyroidism, unspecified: E03.9

## 2024-01-17 HISTORY — DX: Headache, unspecified: R51.9

## 2024-01-17 HISTORY — DX: Pituitary-dependent Cushing's disease: E24.0

## 2024-01-17 NOTE — Patient Instructions (Addendum)
 Your procedure is scheduled on: 01/25/2024  Report to the Registration Desk on the 1st floor of the [facility]. To find out your arrival time, please call [phone] between 1PM - 3PM on: July 07/2023  If your arrival time is 6:00 am, do not arrive before that time as the [facility] entrance doors do not open until 6:00 am.  REMEMBER: Instructions that are not followed completely may result in serious medical risk, up to and including death; or upon the discretion of your surgeon and anesthesiologist your surgery may need to be rescheduled.  Do not eat food after midnight the night before surgery.  No gum chewing or hard candies.  You may however, drink CLEAR liquids up to 2 hours before you are scheduled to arrive for your surgery. Do not drink anything within 2 hours of your scheduled arrival time.  Clear liquids include: - water     In addition, your doctor has ordered for you to drink the provided:   Ensure Drinking this carbohydrate drink up to two hours before surgery helps to reduce insulin resistance and improve patient outcomes. Please complete drinking 2 hours before scheduled arrival time.  One week prior to surgery: Stop Anti-inflammatories (NSAIDS) such as Advil, Aleve, Ibuprofen, Motrin, Naproxen, Naprosyn and Aspirin based products such as Excedrin, Goody's Powder, BC Powder. Stop ANY OVER THE COUNTER supplements until after surgery.  You may however, continue to take Tylenol if needed for pain up until the day of surgery.  Hold GLP-1 tirzepatide (ZEPBOUND)   seven days prior to surgery. Last dose is 01/14/2024    Continue taking all of your other prescription medications up until the day of surgery.  ON THE DAY OF SURGERY ONLY TAKE THESE MEDICATIONS WITH SIPS OF WATER :  desvenlafaxine (PRISTIQ) cyclobenzaprine (FLEXERIL) HYDROcodone-acetaminophen (NORCO/VICODIN) levothyroxine (SYNTHROID) metoprolol succinate (TOPROL-XL)    No Alcohol for 24 hours before  or after surgery.  No Smoking including e-cigarettes for 24 hours before surgery.  No chewable tobacco products for at least 6 hours before surgery.  No nicotine patches on the day of surgery.  Do not use any recreational drugs for at least a week (preferably 2 weeks) before your surgery.  Please be advised that the combination of cocaine and anesthesia may have negative outcomes, up to and including death. If you test positive for cocaine, your surgery will be cancelled.  On the morning of surgery brush your teeth with toothpaste and water , you may rinse your mouth with mouthwash if you wish. Do not swallow any toothpaste or mouthwash.  Use CHG Soap or wipes as directed on instruction sheet.  Do not wear jewelry, make-up, hairpins, clips or nail polish.  For welded (permanent) jewelry: bracelets, anklets, waist bands, etc.  Please have this removed prior to surgery.  If it is not removed, there is a chance that hospital personnel will need to cut it off on the day of surgery.  Do not wear lotions, powders, or perfumes.   Do not shave body hair from the neck down 48 hours before surgery.  Contact lenses, hearing aids and dentures may not be worn into surgery.  Do not bring valuables to the hospital. [facility] is not responsible for any missing/lost belongings or valuables.    Notify your doctor if there is any change in your medical condition (cold, fever, infection).  Wear comfortable clothing (specific to your surgery type) to the hospital.  After surgery, you can help prevent lung complications by doing breathing exercises.  Take deep breaths and cough every 1-2 hours. Your doctor may order a device called an Incentive Spirometer to help you take deep breaths. When coughing or sneezing, hold a pillow firmly against your incision with both hands. This is called "splinting." Doing this helps protect your incision. It also decreases belly discomfort.  If you are being admitted  to the hospital overnight, leave your suitcase in the car. After surgery it may be brought to your room.   If you are being discharged the day of surgery, you will not be allowed to drive home. You will need a responsible individual to drive you home and stay with you for 24 hours after surgery.   Please call the Pre-admissions Testing Dept. at [phone] if you have any questions about these instructions.  Surgery Visitation Policy:  Patients having surgery or a procedure may have two visitors.  Children under the age of 7 must have an adult with them who is not the patient.  Inpatient Visitation:    Visiting hours are 7 a.m. to 8 p.m. Up to four visitors are allowed at one time in a patient room. The visitors may rotate out with other people during the day.  One visitor age 97 or older may stay with the patient overnight and must be in the room by 8 p.m.   Merchandiser, retail to address health-related social needs:  [email or web address]     Preparing for Surgery with CHLORHEXIDINE GLUCONATE (CHG) Soap  Chlorhexidine Gluconate (CHG) Soap  o An antiseptic cleaner that kills germs and bonds with the skin to continue killing germs even after washing  o Used for showering the night before surgery and morning of surgery  Before surgery, you can play an important role by reducing the number of germs on your skin.  CHG (Chlorhexidine gluconate) soap is an antiseptic cleanser which kills germs and bonds with the skin to continue killing germs even after washing.  Please do not use if you have an allergy to CHG or antibacterial soaps. If your skin becomes reddened/irritated stop using the CHG.  1. Shower the NIGHT BEFORE SURGERY and the MORNING OF SURGERY with CHG soap.  2. If you choose to wash your hair, wash your hair first as usual with your normal shampoo.  3. After shampooing, rinse your hair and body thoroughly to remove the shampoo.  4. Use CHG as  you would any other liquid soap. You can apply CHG directly to the skin and wash gently with a scrungie or a clean washcloth.  5. Apply the CHG soap to your body only from the neck down. Do not use on open wounds or open sores. Avoid contact with your eyes, ears, mouth, and genitals (private parts). Wash face and genitals (private parts) with your normal soap.  6. Wash thoroughly, paying special attention to the area where your surgery will be performed.  7. Thoroughly rinse your body with warm water .  8. Do not shower/wash with your normal soap after using and rinsing off the CHG soap.  9. Pat yourself dry with a clean towel.  10. Wear clean pajamas to bed the night before surgery.  12. Place clean sheets on your bed the night of your first shower and do not sleep with pets.  13. Shower again with the CHG soap on the day of surgery prior to arriving at the hospital.  14. Do not apply any deodorants/lotions/powders.  15. Please wear clean clothes to the hospital.

## 2024-01-18 ENCOUNTER — Encounter: Payer: Self-pay | Admitting: Urgent Care

## 2024-01-18 ENCOUNTER — Encounter
Admission: RE | Admit: 2024-01-18 | Discharge: 2024-01-18 | Disposition: A | Discharge status: Home / Self Care | Source: Ambulatory Visit | Attending: Obstetrics and Gynecology | Admitting: Obstetrics and Gynecology

## 2024-01-18 DIAGNOSIS — Z01818 Encounter for other preprocedural examination: Secondary | ICD-10-CM | POA: Insufficient documentation

## 2024-01-18 DIAGNOSIS — R102 Pelvic and perineal pain: Secondary | ICD-10-CM | POA: Diagnosis not present

## 2024-01-18 DIAGNOSIS — Z01812 Encounter for preprocedural laboratory examination: Secondary | ICD-10-CM

## 2024-01-18 DIAGNOSIS — G8929 Other chronic pain: Secondary | ICD-10-CM | POA: Insufficient documentation

## 2024-01-18 LAB — TYPE AND SCREEN
ABO/RH(D): A POS
Antibody Screen: NEGATIVE

## 2024-01-18 LAB — BASIC METABOLIC PANEL WITH GFR
Anion gap: 8 (ref 5–15)
BUN: 14 mg/dL (ref 6–20)
CO2: 23 mmol/L (ref 22–32)
Calcium: 10 mg/dL (ref 8.9–10.3)
Chloride: 108 mmol/L (ref 98–111)
Creatinine, Ser: 1.02 mg/dL — ABNORMAL HIGH (ref 0.44–1.00)
GFR, Estimated: >60 mL/min (ref >60)
Glucose, Bld: 104 mg/dL — ABNORMAL HIGH (ref 70–99)
Potassium: 4 mmol/L (ref 3.5–5.1)
Sodium: 139 mmol/L (ref 135–145)

## 2024-01-18 LAB — CBC
HCT: 42.8 % (ref 36.0–46.0)
Hemoglobin: 13.9 g/dL (ref 12.0–15.0)
MCH: 28.4 pg (ref 26.0–34.0)
MCHC: 32.5 g/dL (ref 30.0–36.0)
MCV: 87.5 fL (ref 80.0–100.0)
Platelets: 379 10*3/uL (ref 150–400)
RBC: 4.89 MIL/uL (ref 3.87–5.11)
RDW: 13.3 % (ref 11.5–15.5)
WBC: 8.5 10*3/uL (ref 4.0–10.5)
nRBC: 0 % (ref 0.0–0.2)

## 2024-01-25 ENCOUNTER — Other Ambulatory Visit: Payer: Self-pay

## 2024-01-25 ENCOUNTER — Ambulatory Visit: Admitting: Anesthesiology

## 2024-01-25 ENCOUNTER — Encounter: Payer: Self-pay | Admitting: Obstetrics and Gynecology

## 2024-01-25 ENCOUNTER — Encounter
Admission: RE | Disposition: A | Discharge status: Home / Self Care | Payer: Self-pay | Source: Ambulatory Visit | Attending: Obstetrics and Gynecology

## 2024-01-25 ENCOUNTER — Ambulatory Visit
Admission: RE | Admit: 2024-01-25 | Discharge: 2024-01-25 | Disposition: A | Discharge status: Home / Self Care | Source: Ambulatory Visit | Attending: Obstetrics and Gynecology | Admitting: Obstetrics and Gynecology

## 2024-01-25 DIAGNOSIS — M797 Fibromyalgia: Secondary | ICD-10-CM | POA: Diagnosis not present

## 2024-01-25 DIAGNOSIS — Z01818 Encounter for other preprocedural examination: Secondary | ICD-10-CM

## 2024-01-25 DIAGNOSIS — S36498A Other injury of other part of small intestine, initial encounter: Secondary | ICD-10-CM

## 2024-01-25 DIAGNOSIS — E249 Cushing's syndrome, unspecified: Secondary | ICD-10-CM | POA: Diagnosis not present

## 2024-01-25 DIAGNOSIS — R102 Pelvic and perineal pain: Secondary | ICD-10-CM | POA: Insufficient documentation

## 2024-01-25 DIAGNOSIS — Z79899 Other long term (current) drug therapy: Secondary | ICD-10-CM

## 2024-01-25 DIAGNOSIS — K219 Gastro-esophageal reflux disease without esophagitis: Secondary | ICD-10-CM | POA: Diagnosis not present

## 2024-01-25 DIAGNOSIS — N946 Dysmenorrhea, unspecified: Secondary | ICD-10-CM | POA: Diagnosis not present

## 2024-01-25 DIAGNOSIS — Z6841 Body Mass Index (BMI) 40.0 and over, adult: Secondary | ICD-10-CM | POA: Insufficient documentation

## 2024-01-25 DIAGNOSIS — G8929 Other chronic pain: Secondary | ICD-10-CM | POA: Diagnosis present

## 2024-01-25 DIAGNOSIS — I10 Essential (primary) hypertension: Secondary | ICD-10-CM | POA: Diagnosis not present

## 2024-01-25 HISTORY — PX: LAPAROSCOPIC SMALL BOWEL RESECTION: SHX5929

## 2024-01-25 HISTORY — PX: LAPAROSCOPIC VAGINAL HYSTERECTOMY WITH SALPINGO OOPHORECTOMY: SHX6681

## 2024-01-25 LAB — POCT PREGNANCY, URINE: Preg Test, Ur: NEGATIVE

## 2024-01-25 LAB — ABO/RH: ABO/RH(D): A POS

## 2024-01-25 SURGERY — HYSTERECTOMY, VAGINAL, LAPAROSCOPY-ASSISTED, WITH SALPINGO-OOPHORECTOMY
Anesthesia: General | Site: Abdomen

## 2024-01-25 MED ORDER — FENTANYL CITRATE (PF) 100 MCG/2ML IJ SOLN
INTRAMUSCULAR | Status: AC
  Filled 2024-01-25: qty 2

## 2024-01-25 MED ORDER — OXYCODONE HCL 5 MG PO TABS
5.0000 mg | ORAL_TABLET | Freq: Once | ORAL | Status: AC | PRN
  Administered 2024-01-25: 5 mg via ORAL

## 2024-01-25 MED ORDER — DIPHENHYDRAMINE HCL 50 MG/ML IJ SOLN
INTRAMUSCULAR | Status: DC | PRN
  Administered 2024-01-25: 25 mg via INTRAVENOUS

## 2024-01-25 MED ORDER — LACTATED RINGERS IV SOLN: INTRAVENOUS | Status: DC

## 2024-01-25 MED ORDER — OXYCODONE-ACETAMINOPHEN 5-325 MG PO TABS: 1.0000 | ORAL_TABLET | ORAL | Status: DC | PRN

## 2024-01-25 MED ORDER — CEFAZOLIN SODIUM-DEXTROSE 2-4 GM/100ML-% IV SOLN
2.0000 g | Freq: Once | INTRAVENOUS | Status: AC
  Administered 2024-01-25: 2 g via INTRAVENOUS

## 2024-01-25 MED ORDER — ONDANSETRON HCL 4 MG/2ML IJ SOLN
INTRAMUSCULAR | Status: DC | PRN
Start: 2024-01-25 — End: 2024-01-25
  Administered 2024-01-25: 4 mg via INTRAVENOUS

## 2024-01-25 MED ORDER — SODIUM CHLORIDE (PF) 0.9 % IJ SOLN
INTRAMUSCULAR | Status: AC
  Filled 2024-01-25: qty 50

## 2024-01-25 MED ORDER — LIDOCAINE-EPINEPHRINE 1 %-1:100000 IJ SOLN
INTRAMUSCULAR | Status: DC | PRN
  Administered 2024-01-25: 10 mL

## 2024-01-25 MED ORDER — LIDOCAINE HCL (CARDIAC) PF 100 MG/5ML IV SOSY
PREFILLED_SYRINGE | INTRAVENOUS | Status: DC | PRN
Start: 2024-01-25 — End: 2024-01-25
  Administered 2024-01-25: 80 mg via INTRAVENOUS

## 2024-01-25 MED ORDER — EPHEDRINE SULFATE-NACL 50-0.9 MG/10ML-% IV SOSY
PREFILLED_SYRINGE | INTRAVENOUS | Status: DC | PRN
  Administered 2024-01-25: 10 mg via INTRAVENOUS

## 2024-01-25 MED ORDER — ROCURONIUM BROMIDE 100 MG/10ML IV SOLN
INTRAVENOUS | Status: DC | PRN
  Administered 2024-01-25: 30 mg via INTRAVENOUS
  Administered 2024-01-25: 70 mg via INTRAVENOUS
  Administered 2024-01-25: 20 mg via INTRAVENOUS

## 2024-01-25 MED ORDER — PROPOFOL 10 MG/ML IV BOLUS
INTRAVENOUS | Status: DC | PRN
  Administered 2024-01-25: 150 mg via INTRAVENOUS

## 2024-01-25 MED ORDER — ACETAMINOPHEN 500 MG PO TABS
1000.0000 mg | ORAL_TABLET | ORAL | Status: AC
  Administered 2024-01-25: 1000 mg via ORAL

## 2024-01-25 MED ORDER — DEXAMETHASONE SODIUM PHOSPHATE 10 MG/ML IJ SOLN
INTRAMUSCULAR | Status: DC | PRN
  Administered 2024-01-25: 10 mg via INTRAVENOUS

## 2024-01-25 MED ORDER — LIDOCAINE HCL (PF) 2 % IJ SOLN
INTRAMUSCULAR | Status: AC
  Filled 2024-01-25: qty 5

## 2024-01-25 MED ORDER — 0.9 % SODIUM CHLORIDE (POUR BTL) OPTIME
TOPICAL | Status: DC | PRN
  Administered 2024-01-25: 1000 mL

## 2024-01-25 MED ORDER — BUPIVACAINE HCL (PF) 0.5 % IJ SOLN
INTRAMUSCULAR | Status: AC
  Filled 2024-01-25: qty 30

## 2024-01-25 MED ORDER — MIDAZOLAM HCL 2 MG/2ML IJ SOLN
INTRAMUSCULAR | Status: DC | PRN
  Administered 2024-01-25: 2 mg via INTRAVENOUS

## 2024-01-25 MED ORDER — MIDAZOLAM HCL 2 MG/2ML IJ SOLN
INTRAMUSCULAR | Status: AC
Start: 2024-01-25 — End: 2024-01-25
  Filled 2024-01-25: qty 2

## 2024-01-25 MED ORDER — SODIUM CHLORIDE 0.9% FLUSH: 3.0000 mL | INTRAVENOUS | Status: DC | PRN

## 2024-01-25 MED ORDER — SUGAMMADEX SODIUM 500 MG/5ML IV SOLN
INTRAVENOUS | Status: DC | PRN
  Administered 2024-01-25: 400 mg via INTRAVENOUS

## 2024-01-25 MED ORDER — BUPIVACAINE HCL 0.5 % IJ SOLN
INTRAMUSCULAR | Status: DC | PRN
  Administered 2024-01-25: 26 mL
  Administered 2024-01-25: 4 mL

## 2024-01-25 MED ORDER — LIDOCAINE-EPINEPHRINE 1 %-1:100000 IJ SOLN
INTRAMUSCULAR | Status: AC
  Filled 2024-01-25: qty 1

## 2024-01-25 MED ORDER — DEXAMETHASONE SODIUM PHOSPHATE 10 MG/ML IJ SOLN
INTRAMUSCULAR | Status: AC
  Filled 2024-01-25: qty 1

## 2024-01-25 MED ORDER — POVIDONE-IODINE 10 % EX SWAB: 2.0000 | Freq: Once | CUTANEOUS | Status: DC

## 2024-01-25 MED ORDER — KETOROLAC TROMETHAMINE 30 MG/ML IJ SOLN
INTRAMUSCULAR | Status: DC | PRN
  Administered 2024-01-25: 30 mg via INTRAVENOUS

## 2024-01-25 MED ORDER — DEXMEDETOMIDINE HCL IN NACL 80 MCG/20ML IV SOLN
INTRAVENOUS | Status: DC | PRN
  Administered 2024-01-25: 8 ug via INTRAVENOUS
  Administered 2024-01-25: 12 ug via INTRAVENOUS

## 2024-01-25 MED ORDER — PHENYLEPHRINE 80 MCG/ML (10ML) SYRINGE FOR IV PUSH (FOR BLOOD PRESSURE SUPPORT)
PREFILLED_SYRINGE | INTRAVENOUS | Status: DC | PRN
Start: 2024-01-25 — End: 2024-01-25
  Administered 2024-01-25 (×4): 160 ug via INTRAVENOUS

## 2024-01-25 MED ORDER — ONDANSETRON HCL 4 MG/2ML IJ SOLN
INTRAMUSCULAR | Status: AC
  Filled 2024-01-25: qty 2

## 2024-01-25 MED ORDER — GLYCOPYRROLATE 0.2 MG/ML IJ SOLN
INTRAMUSCULAR | Status: DC | PRN
  Administered 2024-01-25: .2 mg via INTRAVENOUS

## 2024-01-25 MED ORDER — ACETAMINOPHEN 500 MG PO TABS
ORAL_TABLET | ORAL | Status: AC
Start: 2024-01-25 — End: 2024-01-25
  Filled 2024-01-25: qty 2

## 2024-01-25 MED ORDER — FENTANYL CITRATE (PF) 100 MCG/2ML IJ SOLN
INTRAMUSCULAR | Status: DC | PRN
  Administered 2024-01-25 (×2): 50 ug via INTRAVENOUS

## 2024-01-25 MED ORDER — CHLORHEXIDINE GLUCONATE 0.12 % MT SOLN
OROMUCOSAL | Status: AC
  Filled 2024-01-25: qty 15

## 2024-01-25 MED ORDER — OXYCODONE HCL 5 MG/5ML PO SOLN: 5.0000 mg | Freq: Once | ORAL | Status: AC | PRN

## 2024-01-25 MED ORDER — KETAMINE HCL 50 MG/5ML IJ SOSY
PREFILLED_SYRINGE | INTRAMUSCULAR | Status: AC
  Filled 2024-01-25: qty 5

## 2024-01-25 MED ORDER — CEFAZOLIN SODIUM-DEXTROSE 2-4 GM/100ML-% IV SOLN
INTRAVENOUS | Status: AC
  Filled 2024-01-25: qty 100

## 2024-01-25 MED ORDER — SODIUM CHLORIDE 0.9% FLUSH: 3.0000 mL | Freq: Two times a day (BID) | INTRAVENOUS | Status: DC

## 2024-01-25 MED ORDER — ROCURONIUM BROMIDE 10 MG/ML (PF) SYRINGE
PREFILLED_SYRINGE | INTRAVENOUS | Status: AC
  Filled 2024-01-25: qty 20

## 2024-01-25 MED ORDER — PHENYLEPHRINE HCL-NACL 20-0.9 MG/250ML-% IV SOLN
INTRAVENOUS | Status: AC
  Filled 2024-01-25: qty 250

## 2024-01-25 MED ORDER — PROPOFOL 10 MG/ML IV BOLUS
INTRAVENOUS | Status: AC
  Filled 2024-01-25: qty 20

## 2024-01-25 MED ORDER — CHLORHEXIDINE GLUCONATE 0.12 % MT SOLN
15.0000 mL | Freq: Once | OROMUCOSAL | Status: AC
  Administered 2024-01-25: 15 mL via OROMUCOSAL

## 2024-01-25 MED ORDER — KETAMINE HCL 50 MG/5ML IJ SOSY
PREFILLED_SYRINGE | INTRAMUSCULAR | Status: DC | PRN
  Administered 2024-01-25: 20 mg via INTRAVENOUS
  Administered 2024-01-25: 30 mg via INTRAVENOUS

## 2024-01-25 MED ORDER — SODIUM CHLORIDE 0.9 % IR SOLN
Status: DC | PRN
  Administered 2024-01-25: 3000 mL

## 2024-01-25 MED ORDER — ORAL CARE MOUTH RINSE
15.0000 mL | Freq: Once | OROMUCOSAL | Status: AC
Start: 2024-01-25 — End: 2024-01-25

## 2024-01-25 MED ORDER — KETOROLAC TROMETHAMINE 30 MG/ML IJ SOLN
INTRAMUSCULAR | Status: AC
  Filled 2024-01-25: qty 1

## 2024-01-25 MED ORDER — ONDANSETRON HCL 4 MG/2ML IJ SOLN: 4.0000 mg | Freq: Four times a day (QID) | INTRAMUSCULAR | Status: DC | PRN

## 2024-01-25 MED ORDER — OXYCODONE HCL 5 MG PO TABS
ORAL_TABLET | ORAL | Status: AC
  Filled 2024-01-25: qty 1

## 2024-01-25 MED ORDER — VASOPRESSIN 20 UNIT/ML IV SOLN
INTRAVENOUS | Status: AC
Start: 2024-01-25 — End: 2024-01-25
  Filled 2024-01-25: qty 1

## 2024-01-25 MED ORDER — FENTANYL CITRATE (PF) 100 MCG/2ML IJ SOLN
25.0000 ug | INTRAMUSCULAR | Status: DC | PRN
  Administered 2024-01-25 (×3): 25 ug via INTRAVENOUS

## 2024-01-25 SURGICAL SUPPLY — 53 items
BAG URINE DRAIN 2000ML AR STRL (UROLOGICAL SUPPLIES) ×2 IMPLANT
BLADE SURG SZ11 CARB STEEL (BLADE) ×2 IMPLANT
CATH ROBINSON RED A/P 16FR (CATHETERS) IMPLANT
CATH URTH 16FR FL 2W BLN LF (CATHETERS) ×2 IMPLANT
CHLORAPREP W/TINT 26 (MISCELLANEOUS) ×2 IMPLANT
DRAPE SURG 17X11 SM STRL (DRAPES) ×2 IMPLANT
DRSG TEGADERM 2-3/8X2-3/4 SM (GAUZE/BANDAGES/DRESSINGS) ×8 IMPLANT
ELECTRODE REM PT RTRN 9FT ADLT (ELECTROSURGICAL) ×2 IMPLANT
GAUZE 4X4 16PLY ~~LOC~~+RFID DBL (SPONGE) ×6 IMPLANT
GAUZE SPONGE 2X2 STRL 8-PLY (GAUZE/BANDAGES/DRESSINGS) ×6 IMPLANT
GLOVE SURG SYN 8.0 (GLOVE) ×8 IMPLANT
GLOVE SURG SYN 8.0 PF PI (GLOVE) ×6 IMPLANT
GOWN STRL REUS W/ TWL LRG LVL3 (GOWN DISPOSABLE) ×4 IMPLANT
GOWN STRL REUS W/ TWL XL LVL3 (GOWN DISPOSABLE) ×6 IMPLANT
GRASPER SUT TROCAR 14GX15 (MISCELLANEOUS) IMPLANT
IRRIGATION STRYKERFLOW (MISCELLANEOUS) IMPLANT
IV LACTATED RINGERS 1000ML (IV SOLUTION) IMPLANT
KIT PINK PAD W/HEAD ARM REST (MISCELLANEOUS) ×2 IMPLANT
KIT TURNOVER CYSTO (KITS) IMPLANT
LABEL OR SOLS (LABEL) ×2 IMPLANT
MANIFOLD NEPTUNE II (INSTRUMENTS) ×2 IMPLANT
NDL HYPO 22X1.5 SAFETY MO (MISCELLANEOUS) ×2 IMPLANT
NDL INSUFFLATION 14GA 120MM (NEEDLE) IMPLANT
NDL SPNL 22GX3.5 QUINCKE BK (NEEDLE) IMPLANT
NEEDLE HYPO 22X1.5 SAFETY MO (MISCELLANEOUS) ×2 IMPLANT
NEEDLE INSUFFLATION 14GA 120MM (NEEDLE) IMPLANT
NEEDLE SPNL 22GX3.5 QUINCKE BK (NEEDLE) ×2 IMPLANT
NS IRRIG 1000ML POUR BTL (IV SOLUTION) IMPLANT
PACK BASIN MINOR ARMC (MISCELLANEOUS) ×2 IMPLANT
PACK GYN LAPAROSCOPIC (MISCELLANEOUS) ×2 IMPLANT
PAD PREP 24X41 OB/GYN DISP (PERSONAL CARE ITEMS) ×2 IMPLANT
POWDER SURGICEL 3.0 GRAM (HEMOSTASIS) IMPLANT
SCISSORS LAP 5X35 DISP (ENDOMECHANICALS) IMPLANT
SHEARS HARMONIC 36 ACE (MISCELLANEOUS) IMPLANT
SLEEVE Z-THREAD 5X100MM (TROCAR) ×4 IMPLANT
SOL .9 NS 3000ML IRR UROMATIC (IV SOLUTION) IMPLANT
SOLUTION PREP PVP 2OZ (MISCELLANEOUS) ×4 IMPLANT
SUT PDS 2-0 27IN (SUTURE) IMPLANT
SUT VIC AB 0 CT1 27XCR 8 STRN (SUTURE) ×4 IMPLANT
SUT VIC AB 0 CT1 36 (SUTURE) ×2 IMPLANT
SUT VIC AB 0 CT2 27 (SUTURE) IMPLANT
SUT VIC AB 2-0 SH 27XBRD (SUTURE) IMPLANT
SUT VIC AB 2-0 UR6 27 (SUTURE) IMPLANT
SUT VIC AB 3-0 SH 27X BRD (SUTURE) IMPLANT
SUT VIC AB 4-0 SH 27XANBCTRL (SUTURE) ×2 IMPLANT
SYR 10ML LL (SYRINGE) ×2 IMPLANT
SYR CONTROL 10ML LL (SYRINGE) ×2 IMPLANT
TIP ENDOSCOPIC SURGICEL (TIP) IMPLANT
TRAP FLUID SMOKE EVACUATOR (MISCELLANEOUS) ×2 IMPLANT
TROCAR SLEEVE LAP 5X150 (TROCAR) IMPLANT
TROCAR Z THREAD OPTICAL 5X150 (TROCAR) IMPLANT
TROCAR Z-THREAD FIOS 5X100MM (TROCAR) ×2 IMPLANT
TUBING EVAC SMOKE HEATED PNEUM (TUBING) ×2 IMPLANT

## 2024-01-25 NOTE — Anesthesia Preprocedure Evaluation (Signed)
 Anesthesia Evaluation  Patient identified by MRN, date of birth, ID band Patient awake    Reviewed: Allergy & Precautions, NPO status , Patient's Chart, lab work & pertinent test results  History of Anesthesia Complications Negative for: history of anesthetic complications  Airway Mallampati: II  TM Distance: <3 FB Neck ROM: full    Dental  (+) Chipped, Poor Dentition, Missing   Pulmonary neg shortness of breath, sleep apnea    Pulmonary exam normal        Cardiovascular hypertension, negative cardio ROS Normal cardiovascular exam     Neuro/Psych  Headaches PSYCHIATRIC DISORDERS       Neuromuscular disease    GI/Hepatic negative GI ROS, Neg liver ROS,neg GERD  ,,  Endo/Other  Hypothyroidism    Renal/GU      Musculoskeletal   Abdominal   Peds  Hematology negative hematology ROS (+)   Anesthesia Other Findings Past Medical History: No date: Cushing disease (HCC) No date: Fibromyalgia No date: Headache No date: Hypertension No date: Hypothyroidism No date: Thyroid disease  Past Surgical History: No date: CERVICAL FUSION 04/29/2023: COLONOSCOPY WITH PROPOFOL ; N/A     Comment:  Procedure: COLONOSCOPY WITH PROPOFOL ;  Surgeon: Onita Elspeth Sharper, DO;  Location: [facility];  Service:               Gastroenterology;  Laterality: N/A; 04/29/2023: ESOPHAGOGASTRODUODENOSCOPY (EGD) WITH PROPOFOL ; N/A     Comment:  Procedure: ESOPHAGOGASTRODUODENOSCOPY (EGD) WITH               PROPOFOL ;  Surgeon: Onita Elspeth Sharper, DO;  Location:              [facility]COPY;  Service: Gastroenterology;  Laterality:               N/A; No date: TUBAL LIGATION  BMI    Body Mass Index: 42.43 kg/m      Reproductive/Obstetrics negative OB ROS                              Anesthesia Physical Anesthesia Plan  ASA: 3  Anesthesia Plan: General ETT   Post-op Pain Management:     Induction: Intravenous  PONV Risk Score and Plan: Ondansetron, Dexamethasone, Midazolam and Treatment may vary due to age or medical condition  Airway Management Planned: Oral ETT  Additional Equipment:   Intra-op Plan:   Post-operative Plan: Extubation in OR  Informed Consent: I have reviewed the patients History and Physical, chart, labs and discussed the procedure including the risks, benefits and alternatives for the proposed anesthesia with the patient or authorized representative who has indicated his/her understanding and acceptance.     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 She has not had allergy testing for this. She reports no problems with lidocaine  since then. Plan to expose her to lidocaine  and bupivacaine as needed as these are not esters.  Patient consented for risks of anesthesia including but not limited to:  - adverse reactions to medications - damage to eyes, teeth, lips or other oral mucosa - nerve damage due to positioning  - sore throat or hoarseness - Damage to heart, brain, nerves, lungs, other parts of body or loss of life  Patient voiced understanding and assent.)  Anesthesia Quick Evaluation

## 2024-01-25 NOTE — Anesthesia Procedure Notes (Signed)
 Procedure Name: Intubation Date/Time: 01/25/2024 8:31 AM  Performed by: Landy Francena BIRCH, CRNAPre-anesthesia Checklist: Patient identified, Emergency Drugs available, Suction available and Patient being monitored Patient Re-evaluated:Patient Re-evaluated prior to induction Oxygen Delivery Method: Circle system utilized Preoxygenation: Pre-oxygenation with 100% oxygen Induction Type: IV induction Ventilation: Mask ventilation without difficulty and Oral airway inserted - appropriate to patient size Laryngoscope Size: McGrath and 3 Grade View: Grade I Tube type: Oral Tube size: 7.0 mm Number of attempts: 1 Airway Equipment and Method: Stylet, Oral airway and Bite block Placement Confirmation: ETT inserted through vocal cords under direct vision, positive ETCO2 and breath sounds checked- equal and bilateral Secured at: 21 cm Tube secured with: Tape Dental Injury: Teeth and Oropharynx as per pre-operative assessment

## 2024-01-25 NOTE — Progress Notes (Signed)
 Scheduled for LAVH / BSO . LAbs reviewed. Allquestions answered . Proceed

## 2024-01-25 NOTE — Anesthesia Postprocedure Evaluation (Signed)
 Anesthesia Post Note  Patient: Cheyenne Lynn  Procedure(s) Performed: HYSTERECTOMY, VAGINAL, LAPAROSCOPY-ASSISTED, WITH SALPINGO-OOPHORECTOMY (Bilateral: Abdomen) EXCISION, SMALL INTESTINE, LAPAROSCOPIC (Abdomen)  Patient location during evaluation: PACU Anesthesia Type: General Level of consciousness: awake and alert Pain management: pain level controlled Vital Signs Assessment: post-procedure vital signs reviewed and stable Respiratory status: spontaneous breathing, nonlabored ventilation and respiratory function stable Cardiovascular status: blood pressure returned to baseline and stable Postop Assessment: no apparent nausea or vomiting Anesthetic complications: no   No notable events documented.   Last Vitals:  Vitals:   01/25/24 1245 01/25/24 1300  BP: 125/87 (!) 145/92  Pulse: 77 76  Resp: 16 14  Temp:    SpO2: 95% 100%    Last Pain:  Vitals:   01/25/24 1245  TempSrc:   PainSc: 2                  Fairy POUR Keath Matera

## 2024-01-25 NOTE — Brief Op Note (Signed)
 01/25/2024  11:52 AM  PATIENT:  Powell Cornell  53 y.o. female  PRE-OPERATIVE DIAGNOSIS:  chronic pelvic pain  POST-OPERATIVE DIAGNOSIS:  chronic pelvic pain  PROCEDURE:  LAVH , BSO  Sigmoid bowel suture _ Gen sx    SURGEON:  Surgeons and Role: Panel 1:    * Maninder Deboer, Debby PARAS, MD - Primary    * Taiyana Kissler, Beverli GAILS, MD - Assisting Panel 2:    * Marinda Jayson KIDD, MD - Primary  PHYSICIAN ASSISTANT: PA student Caryl   ASSISTANTS: none   ANESTHESIA:   general  EBL:  100 mL IOF 1800 UO 300 cc  BLOOD ADMINISTERED:none  DRAINS: none   LOCAL MEDICATIONS USED:  MARCAINE     SPECIMEN:  Source of Specimen:  cx , uterus , bilateral fallopian tubes and bilateral ovaries   DISPOSITION OF SPECIMEN:  PATHOLOGY  COUNTS:  YES  TOURNIQUET:  * No tourniquets in log *  DICTATION: .Other Dictation: Dictation Number verbal  PLAN OF CARE: Discharge to home after PACU  PATIENT DISPOSITION:  PACU - hemodynamically stable.   Delay start of Pharmacological VTE agent (>24hrs) due to surgical blood loss or risk of bleeding: not applicable

## 2024-01-25 NOTE — Transfer of Care (Signed)
 Immediate Anesthesia Transfer of Care Note  Patient: Cheyenne Lynn  Procedure(s) Performed: HYSTERECTOMY, VAGINAL, LAPAROSCOPY-ASSISTED, WITH SALPINGO-OOPHORECTOMY (Bilateral: Abdomen) EXCISION, SMALL INTESTINE, LAPAROSCOPIC (Abdomen)  Patient Location: PACU  Anesthesia Type:General  Level of Consciousness: awake, alert , and oriented  Airway & Oxygen Therapy: Patient Spontanous Breathing and Patient connected to face mask oxygen  Post-op Assessment: Report given to RN, Post -op Vital signs reviewed and stable, and Patient moving all extremities  Post vital signs: Reviewed and stable  Last Vitals:  Vitals Value Taken Time  BP 141/95 01/25/24 12:05  Temp    Pulse 74 01/25/24 12:10  Resp 13 01/25/24 12:10  SpO2 100 % 01/25/24 12:10  Vitals shown include unfiled device data.  Last Pain:  Vitals:   01/25/24 0729  TempSrc: Tympanic  PainSc: 0-No pain         Complications: No notable events documented.

## 2024-01-25 NOTE — Discharge Instructions (Signed)
 Oxycodone 5 mg given at 12:35 pm today

## 2024-01-26 ENCOUNTER — Other Ambulatory Visit

## 2024-01-26 ENCOUNTER — Encounter: Payer: Self-pay | Admitting: Obstetrics and Gynecology

## 2024-01-26 DIAGNOSIS — G8929 Other chronic pain: Secondary | ICD-10-CM

## 2024-01-26 LAB — SURGICAL PATHOLOGY

## 2024-01-26 NOTE — Op Note (Deleted)
   The note originally documented on this encounter has been moved the the encounter in which it belongs.

## 2024-01-26 NOTE — Op Note (Signed)
 Operative Note  Preoperative diagnosis: Bowel thermal injury Postoperative diagnosis: Same Surgeon: Jayson Endow, MD Procedure: Oversew of thermal injury of bowel.  Indications: This is a 53 year old woman that was undergoing a upper scopic assisted hysterectomy with bilateral salpingo-oophorectomy.  During dissection she had a thermal injury to the bowel.  The gynecologic team asked me for an intraoperative consult.  Procedure: Notes the patient was already under general endotracheal anesthesia when I was asked to come into the room.  Due to the emergent nature of the procedure consent was not obtained for my part of the procedure.  On examination of the bowel there was a small thermal injury from the Harmonic Scalpel.  I elected to oversew this as there was no evidence of full-thickness tear.  A 5 mm port was placed under direct visualization in the right upper quadrant.  Using a 3-0 Vicryl the area of thermal injury was oversewn in the Lembert fashion.  I then elected to approximate some fat over this serosal repair.  The fat was loosely approximated and tacked down to the area of repair with 3-0 Vicryl suture.  At this time there were serosal injury was unable to be viewed as it was oversewn and it appeared healthy.  The case was then turned back over to my gynecologic colleagues.

## 2024-01-26 NOTE — Op Note (Signed)
 Cheyenne Lynn, Cheyenne Lynn MEDICAL RECORD NO: [id number] ACCOUNT NO: [id number] DATE OF BIRTH: Aug 10, 1970 FACILITY: [facility] LOCATION: [facility] PHYSICIAN: Debby DOROTHA Dinsmore, MD  Operative Report   DATE OF PROCEDURE: 01/25/2024  PREOPERATIVE DIAGNOSES: 1. Chronic pelvic pain. 2. Dyspareunia.  POSTOPERATIVE DIAGNOSES: 1. Chronic pelvic pain. 2. Dyspareunia. 3. Sigmoid thermal injury.  PROCEDURE: 1. Laparoscopic-assisted vaginal hysterectomy. 2. Bilateral salpingo-oophorectomy. 3. Sigmoid serosal repair. See operative report from Dr. Marinda.   SURGEON: Debby DOROTHA Dinsmore, MD  FIRST ASSISTANT: Beverli Dinsmore, MD  SECOND ASSISTANTBETHA Dom, [place] student  CONSULTING: General surgeon Dr. Jayson Marinda.  INDICATIONS: A 53 year old gravida 2 para 2 patient with a long history of lower pelvic pain unresponsive to conservative therapy. The patient does have a history of fibromyalgia. The patient complains of intermittent dyspareunia as well.  DESCRIPTION OF PROCEDURE: After adequate general endotracheal anesthesia, the patient was placed in the dorsal supine position with the legs in Allen stirrups. The patient's abdomen was prepped and draped in normal sterile fashion. The vagina was prepped  and draped in normal sterile fashion. A timeout was performed. The patient did receive 3 g IV Ancef  for surgical prophylaxis prior to commencement of the case. Straight catheterization of the bladder yielded initial 100 mL clear urine. Gloves and gown  were changed and a 5-mm infraumbilical incision was made. Several attempts were made to enter into the abdominal cavity with the 5-mm trocar, unsuccessful. Therefore, an incision was made at Palmer's point in the left upper quadrant 2 cm inferior to the  midclavicular lower rib. A 5-mm port was placed without difficulty. The patient's abdomen was insufflated and two additional ports were placed, one left lower quadrant and one right lower  quadrant, both 3 cm medial to the anterior iliac spines under  direct visualization. Initial impression revealed no significant scar tissue. No evidence of endometriosis. The patient had a small amount of omental adhesions in the right upper quadrant from prior surgery. Ureters could not be identified bilaterally  given 2 adipose tissue in the retroperitoneal space. The left fallopian tube and ovary were grasped, placed on medial tension, and the infundibulopelvic ligament was cauterized with the Kleppinger and then transected with the Harmonic scalpel. The round  ligament was then cauterized, transected, and the broad ligament was opened and the left uterine artery was skeletonized, then cauterized with the Kleppinger and transected with the Harmonic scalpel. Bladder flap was created and the bladder was reflected  inferiorly. Similar procedure was repeated on the right side. Again, the right infundibulopelvic ligament was cauterized, transected, and dissection of the broad ligament including the fallopian tube and ovary, which were still attached to the right  lateral side of the uterus. Uterine artery was skeletonized, cauterized, and transected with Harmonic scalpel. The rest of the bladder flap was opened and reflected inferiorly. During the dissection with Harmonic scalpel, an incidental touching of the  anterior serosa of the bowel was noted. This was a transient touch to the serosa resulting in a 3 mm slight white blanching area consistent with a superficial thermal burn. Intraoperative consultation with Dr. Jayson Marinda, general surgeon, was sought.  He came in and thought to be overly cautious, he should place a figure-of-eight suture over the serosa incorporating the epiploica as adequate tissue support. He put two figure-of-eight sutures in and thought that was all he needed to do and reassured us   and exited. Please reference his operation report. Gynecologic surgeons then proceeded vaginally  where the cervix was grasped with two thyroid tenacula  and the cervix was circumferentially incised and the cervix was injected with 0.5% lidocaine  with  1:100,000 epinephrine . A direct posterior colpotomy incision was made and entry into the posterior cul-de-sac was obtained followed by tagging the posterior peritoneum to the posterior vaginal cuff. Long build weighted speculum was placed followed by  bilateral clamping of the uterosacral ligaments, transecting, and suture ligated for later identification. The anterior cervix was then circumferentially incised with the Bovie and the anterior cul-de-sac was entered with blunt dissection and the Deaver  retractor was placed in to elevate the bladder anteriorly. Cardinal ligaments were bilaterally clamped, transected, and suture ligated with #0 Vicryl suture and two additional dissection clamps and transection of broad ligament tissue occurred with  ultimate release of the uterus, cervix, bilateral fallopian tubes, and ovaries. Good hemostasis was noted. The vaginal cuff was then closed with a running #0 Vicryl suture with good approximation of tissue and good hemostasis noted. Gloves and gown were  changed again and the patient's abdomen was re-insufflated and inspection revealed good hemostasis from the vaginal cuff laparoscopically. Intraoperative pressure was lowered to 7 mmHg with good hemostasis noted. The abdomen was irrigated and suctioned  and the patient's abdomen was deflated and all 5 port sites were closed with interrupted 4-0 Vicryl suture and sterile dressings applied. Final in-and-out catheterization of the bladder yielded an additional 200 mL of clear urine. The patient tolerated  the procedure well.  COMPLICATIONS: Superficial thermal injury to the anterior serosa of the sigmoid colon - repaired.  INTRAOPERATIVE FLUIDS: 1800 mL.  ESTIMATED BLOOD LOSS: 100 mL.  URINE OUTPUT: 300 mL.  DISPOSITION: The patient did receive 30 mg of  Toradol  at the end of the procedure and was taken to the recovery room in good condition.    SUJ D: 01/25/2024 12:27:57 pm T: 01/26/2024 12:20:00 am  JOB: [id number]/ [id number]

## 2024-06-27 ENCOUNTER — Other Ambulatory Visit: Payer: Self-pay | Admitting: Internal Medicine

## 2024-06-27 DIAGNOSIS — G8929 Other chronic pain: Secondary | ICD-10-CM

## 2024-06-27 DIAGNOSIS — M5416 Radiculopathy, lumbar region: Secondary | ICD-10-CM

## 2024-06-28 ENCOUNTER — Ambulatory Visit
Admission: RE | Admit: 2024-06-28 | Discharge: 2024-06-28 | Disposition: A | Source: Ambulatory Visit | Attending: Internal Medicine | Admitting: Internal Medicine

## 2024-06-28 DIAGNOSIS — M5441 Lumbago with sciatica, right side: Secondary | ICD-10-CM | POA: Insufficient documentation

## 2024-06-28 DIAGNOSIS — M5416 Radiculopathy, lumbar region: Secondary | ICD-10-CM | POA: Insufficient documentation

## 2024-06-28 DIAGNOSIS — G8929 Other chronic pain: Secondary | ICD-10-CM | POA: Diagnosis present

## 2024-08-23 NOTE — Progress Notes (Unsigned)
 "  Referring Physician:  Sherial Bail, MD [address] [place],  [place] [address]  Primary Physician:  [facility]  History of Present Illness: 08/23/2024*** Ms. Cheyenne Lynn has a history of HTNk OSA, FM, depression, obesity, OCD, vitamin B12 and vitamin D deficiency, GERD, osteoporosis, PTSD, OSA, thyroid disease.   History of ACDF C5-C7 on 08/20/19 for cervical myelopathy?***   Back pain Right leg pain still?  legs became bowlegged since weight loss   She is taking NALTREXONE?***   EMG 12/28/2022 of bilateral lower extremities were normal.   Duration: *** Location: *** Quality: *** Severity: ***  Precipitating: aggravated by *** Modifying factors: made better by *** Weakness: none Timing: ***  Tobacco use: Does not smoke.   Bowel/Bladder Dysfunction: none  Conservative measures:  Physical therapy: *** has not participated in Multimodal medical therapy including regular antiinflammatories: *** Flexeril, Norco, Advil, Meloxicam Injections: *** no epidural steroid injections  Past Surgery: ***ACDF C5-C7 -when and where?  [name] has ***no symptoms of cervical myelopathy.  The symptoms are causing a significant impact on the patient's life.   Review of Systems:  A 10 point review of systems is negative, except for the pertinent positives and negatives detailed in the HPI.  Past Medical History: Past Medical History:  Diagnosis Date   Cushing disease (HCC)    Fibromyalgia    Headache    Hypertension    Hypothyroidism    Thyroid disease     Past Surgical History: Past Surgical History:  Procedure Laterality Date   CERVICAL FUSION     COLONOSCOPY WITH PROPOFOL  N/A 04/29/2023   Procedure: COLONOSCOPY WITH PROPOFOL ;  Surgeon: Onita Elspeth Sharper, DO;  Location: [facility] ENDOSCOPY;  Service: Gastroenterology;  Laterality: N/A;   ESOPHAGOGASTRODUODENOSCOPY (EGD) WITH PROPOFOL  N/A 04/29/2023   Procedure:  ESOPHAGOGASTRODUODENOSCOPY (EGD) WITH PROPOFOL ;  Surgeon: Onita Elspeth Sharper, DO;  Location: [facility] ENDOSCOPY;  Service: Gastroenterology;  Laterality: N/A;   LAPAROSCOPIC SMALL BOWEL RESECTION N/A 01/25/2024   Procedure: EXCISION, SMALL INTESTINE, LAPAROSCOPIC;  Surgeon: Marinda Jayson KIDD, MD;  Location: [facility] ORS;  Service: General;  Laterality: N/A;  LAPAROSCOPIC REPAIR OF SEROSAL WALL   LAPAROSCOPIC VAGINAL HYSTERECTOMY WITH SALPINGO OOPHORECTOMY Bilateral 01/25/2024   Procedure: HYSTERECTOMY, VAGINAL, LAPAROSCOPY-ASSISTED, WITH SALPINGO-OOPHORECTOMY;  Surgeon: Lovetta Debby PARAS, MD;  Location: [facility] ORS;  Service: Gynecology;  Laterality: Bilateral;   TUBAL LIGATION      Allergies: Allergies as of 08/30/2024 - Review Complete 01/25/2024  Allergen Reaction Noted   Egg protein-containing drug products Anaphylaxis and Swelling 05/20/2015   Onion Anaphylaxis 05/20/2015   Procaine Anaphylaxis and Other (See Comments) 05/20/2015   Shellfish allergy Nausea Only 05/05/2018   Chicken allergy Swelling 05/20/2015   Gabapentin Other (See Comments) 05/31/2019    Medications: Outpatient Encounter Medications as of 08/30/2024  Medication Sig   Ascorbic Acid (VITAMIN C PO) Take 2,000 mg by mouth in the morning.   cyanocobalamin (VITAMIN B12) 1000 MCG tablet Take 1,000 mcg by mouth in the morning.   cyclobenzaprine (FLEXERIL) 10 MG tablet Take 10 mg by mouth daily as needed for muscle spasms.   desvenlafaxine (PRISTIQ) 100 MG 24 hr tablet Take 100 mg by mouth in the morning.   ferrous sulfate 325 (65 FE) MG tablet Take 325 mg by mouth in the morning.   furosemide (LASIX) 20 MG tablet Take 20 mg by mouth daily as needed (fluid retention/swelling). (Patient not taking: Reported on 01/25/2024)   HYDROcodone-acetaminophen  (NORCO/VICODIN) 5-325 MG tablet Take 1 tablet by mouth  every 6 (six) hours as needed.   hydrOXYzine (VISTARIL) 50 MG capsule Take 50 mg by mouth 2 (two) times daily as needed.   ibuprofen  (ADVIL) 800 MG tablet Take 800 mg by mouth every 8 (eight) hours as needed (pain.).   levothyroxine (SYNTHROID) 50 MCG tablet Take 50 mcg by mouth daily before breakfast.   meloxicam (MOBIC) 15 MG tablet Take 15 mg by mouth daily as needed for pain.   metoprolol succinate (TOPROL-XL) 25 MG 24 hr tablet Take 25 mg by mouth at bedtime.   NALTREXONE HCL PO Take 4.5 mg by mouth 2 (two) times daily.   ondansetron  (ZOFRAN ) 8 MG tablet Take 8 mg by mouth every 8 (eight) hours as needed for vomiting or nausea.   prazosin (MINIPRESS) 2 MG capsule Take 2 mg by mouth at bedtime.   tirzepatide (ZEPBOUND) 7.5 MG/0.5ML Pen Inject 7.5 mg into the skin every Sunday.   traZODone (DESYREL) 100 MG tablet Take 200 mg by mouth at bedtime.   No facility-administered encounter medications on file as of 08/30/2024.    Social History: Social History[1]  Family Medical History: Family History  Problem Relation Age of Onset   Breast cancer Mother 54   Breast cancer Maternal Grandmother 25    Physical Examination: There were no vitals filed for this visit.  General: Patient is well developed, well nourished, calm, collected, and in no apparent distress. Attention to examination is appropriate.  Respiratory: Patient is breathing without any difficulty.   NEUROLOGICAL:     Awake, alert, oriented to person, place, and time.  Speech is clear and fluent. Fund of knowledge is appropriate.   Cranial Nerves: Pupils equal round and reactive to light.  Facial tone is symmetric.    *** ROM of cervical spine *** pain *** posterior cervical tenderness. *** tenderness in bilateral trapezial region.   *** ROM of lumbar spine *** pain *** posterior lumbar tenderness.   No abnormal lesions on exposed skin.   Strength: Side Biceps Triceps Deltoid Interossei Grip Wrist Ext. Wrist Flex.  R 5 5 5 5 5 5 5   L 5 5 5 5 5 5 5    Side Iliopsoas Quads Hamstring PF DF EHL  R 5 5 5 5 5 5   L 5 5 5 5 5 5    Reflexes are ***2+  and symmetric at the biceps, brachioradialis, patella and achilles.   Hoffman's is absent.  Clonus is not present.   Bilateral upper and lower extremity sensation is intact to light touch.     Gait is normal.   ***No difficulty with tandem gait.    Medical Decision Making  Imaging: Lumbar MRI dated 06/28/24:  FINDINGS: Segmentation: Rudimentary ribs are seen off the first lumbar type vertebral body and there is rudimentary disc at S1-2. On this exam, the lowest level imaged in the axial plane is labeled L5-S1. Recommend correlation with plain films if any intervention is planned.   Alignment: 0.6 cm anterolisthesis L4 on L5 is due to facet arthritis.   Vertebrae: No fracture, evidence of discitis, or bone lesion. There is degenerative endplate signal change at L4-5.   Conus medullaris and cauda equina: Conus extends to the L2 level. Conus and cauda equina appear normal.   Paraspinal and other soft tissues: Left renal cyst is noted.   Disc levels:   L1-2: Negative.   L2-3: Negative.   L3-4: Moderate to advanced facet arthritis, worse on the right. No disc bulge or protrusion. No stenosis.  L4-5: There is advanced bilateral facet arthritis. The disc is uncovered with a broad-based bulge and more focally protruding disc in the right foramen. There is also ligamentum flavum thickening. The patient has severe central canal stenosis and right foraminal narrowing with impingement on the exiting right L4 root. Mild to moderate left foraminal narrowing is also seen.   L5-S1: There is a shallow disc bulge with a small protrusion in the left foramen. Left worse than right facet arthritis. The central canal and right foramen are open. There is moderately severe left foraminal stenosis.   IMPRESSION: 1. Transitional lumbosacral anatomy. Please see numbering scheme above. Recommend correlation with plain films if any intervention is planned. 2. Severe central canal and right  foraminal narrowing at L4-5 due to a disc bulge, right foraminal protrusion and ligamentum flavum thickening. There is impingement on the exiting right L4 root. 3. Moderately severe left foraminal narrowing at L5-S1 due to a disc bulge and a small protrusion in the left foramen. 4. Moderate to advanced facet arthritis at L3-4 without stenosis.     Electronically Signed   By: Debby Prader M.D.   On: 07/03/2024 09:37    I have personally reviewed the images and agree with the above interpretation.  Assessment and Plan: Ms. Ossa is a pleasant 54 y.o. female has ***  Treatment options discussed with patient and following plan made:   - Order for physical therapy for *** spine ***. Patient to call to schedule appointment. *** - Continue current medications including ***. Reviewed dosing and side effects.  - Prescription for ***. Reviewed dosing and side effects. Take with food.  - Prescription for *** to take prn muscle spasms. Reviewed dosing and side effects. Discussed this can cause drowsiness.  - MRI of *** to further evaluate *** radiculopathy. No improvement time or medications (***).  - Referral to PMR at [facility] to discuss possible *** injections.  - Will schedule phone visit to review MRI results once I get them back.   I spent a total of *** minutes in face-to-face and non-face-to-face activities related to this patient's care today including review of outside records, review of imaging, review of symptoms, physical exam, discussion of differential diagnosis, discussion of treatment options, and documentation.   Thank you for involving me in the care of this patient.   Glade Boys PA-C Dept. of Neurosurgery      [1]  Social History Tobacco Use   Smoking status: Never   Smokeless tobacco: Never  Vaping Use   Vaping status: Never Used  Substance Use Topics   Alcohol use: Not Currently   Drug use: Not Currently   "

## 2024-08-24 ENCOUNTER — Other Ambulatory Visit: Payer: Self-pay

## 2024-08-24 ENCOUNTER — Inpatient Hospital Stay
Admission: RE | Admit: 2024-08-24 | Discharge: 2024-08-24 | Disposition: A | Payer: Self-pay | Source: Ambulatory Visit | Attending: Orthopedic Surgery | Admitting: Orthopedic Surgery

## 2024-08-24 DIAGNOSIS — Z049 Encounter for examination and observation for unspecified reason: Secondary | ICD-10-CM

## 2024-08-30 ENCOUNTER — Ambulatory Visit: Admitting: Orthopedic Surgery

## 2024-09-14 ENCOUNTER — Ambulatory Visit: Admitting: Orthopedic Surgery
